# Patient Record
Sex: Male | Born: 1960 | Race: Black or African American | Hispanic: No | Marital: Married | Smoking: Current every day smoker
Health system: Southern US, Community
[De-identification: ages and names within clinical notes are randomized; demographics above are authoritative.]

## PROBLEM LIST (undated history)

## (undated) DIAGNOSIS — E7800 Pure hypercholesterolemia, unspecified: Secondary | ICD-10-CM

## (undated) DIAGNOSIS — I10 Essential (primary) hypertension: Secondary | ICD-10-CM

## (undated) DIAGNOSIS — I4891 Unspecified atrial fibrillation: Secondary | ICD-10-CM

## (undated) HISTORY — PX: NO PAST SURGERIES: SHX2092

---

## 2011-09-07 ENCOUNTER — Emergency Department: Payer: Self-pay | Admitting: Emergency Medicine

## 2013-06-05 ENCOUNTER — Inpatient Hospital Stay: Payer: Self-pay | Admitting: Family Medicine

## 2013-06-05 LAB — APTT
Activated PTT: 26.8 secs (ref 23.6–35.9)
Activated PTT: 40 secs — ABNORMAL HIGH (ref 23.6–35.9)

## 2013-06-05 LAB — CBC
HCT: 43.1 % (ref 40.0–52.0)
HGB: 14.3 g/dL (ref 13.0–18.0)
MCH: 25.6 pg — ABNORMAL LOW (ref 26.0–34.0)
MCHC: 33.2 g/dL (ref 32.0–36.0)
MCV: 77 fL — ABNORMAL LOW (ref 80–100)
Platelet: 213 10*3/uL (ref 150–440)
RBC: 5.59 10*6/uL (ref 4.40–5.90)
RDW: 14.9 % — ABNORMAL HIGH (ref 11.5–14.5)
WBC: 8.6 10*3/uL (ref 3.8–10.6)

## 2013-06-05 LAB — COMPREHENSIVE METABOLIC PANEL
Albumin: 4 g/dL (ref 3.4–5.0)
Alkaline Phosphatase: 53 U/L (ref 50–136)
Anion Gap: 3 — ABNORMAL LOW (ref 7–16)
BUN: 13 mg/dL (ref 7–18)
Bilirubin,Total: 0.4 mg/dL (ref 0.2–1.0)
Calcium, Total: 9.1 mg/dL (ref 8.5–10.1)
Chloride: 109 mmol/L — ABNORMAL HIGH (ref 98–107)
Co2: 29 mmol/L (ref 21–32)
Creatinine: 0.87 mg/dL (ref 0.60–1.30)
EGFR (African American): >60
EGFR (Non-African Amer.): >60
Glucose: 92 mg/dL (ref 65–99)
Osmolality: 281 (ref 275–301)
Potassium: 3.9 mmol/L (ref 3.5–5.1)
SGOT(AST): 22 U/L (ref 15–37)
SGPT (ALT): 27 U/L (ref 12–78)
Sodium: 141 mmol/L (ref 136–145)
Total Protein: 7.5 g/dL (ref 6.4–8.2)

## 2013-06-05 LAB — PROTIME-INR
INR: 0.9
Prothrombin Time: 12.8 secs (ref 11.5–14.7)

## 2013-06-05 LAB — CK TOTAL AND CKMB (NOT AT ARMC)
CK, Total: 231 U/L (ref 35–232)
CK-MB: 3.6 ng/mL (ref 0.5–3.6)

## 2013-06-05 LAB — TROPONIN I: Troponin-I: 0.15 ng/mL — ABNORMAL HIGH

## 2013-06-06 LAB — BASIC METABOLIC PANEL
Anion Gap: 2 — ABNORMAL LOW (ref 7–16)
BUN: 11 mg/dL (ref 7–18)
Calcium, Total: 8.7 mg/dL (ref 8.5–10.1)
Chloride: 107 mmol/L (ref 98–107)
Co2: 29 mmol/L (ref 21–32)
Creatinine: 0.8 mg/dL (ref 0.60–1.30)
EGFR (African American): >60
EGFR (Non-African Amer.): >60
Glucose: 98 mg/dL (ref 65–99)
Osmolality: 275 (ref 275–301)
Potassium: 4 mmol/L (ref 3.5–5.1)
Sodium: 138 mmol/L (ref 136–145)

## 2013-06-06 LAB — CK-MB
CK-MB: 2.9 ng/mL (ref 0.5–3.6)
CK-MB: 3.2 ng/mL (ref 0.5–3.6)

## 2013-06-06 LAB — LIPID PANEL
Cholesterol: 172 mg/dL (ref 0–200)
HDL Cholesterol: 44 mg/dL (ref 40–60)
Ldl Cholesterol, Calc: 101 mg/dL — ABNORMAL HIGH (ref 0–100)
Triglycerides: 136 mg/dL (ref 0–200)
VLDL Cholesterol, Calc: 27 mg/dL (ref 5–40)

## 2013-06-06 LAB — APTT
Activated PTT: 42.6 secs — ABNORMAL HIGH (ref 23.6–35.9)
Activated PTT: 25.2 secs (ref 23.6–35.9)

## 2013-06-06 LAB — HEMOGLOBIN A1C: Hemoglobin A1C: 5.7 % (ref 4.2–6.3)

## 2013-06-06 LAB — TROPONIN I
Troponin-I: 0.13 ng/mL — ABNORMAL HIGH
Troponin-I: 0.13 ng/mL — ABNORMAL HIGH

## 2015-03-07 NOTE — Discharge Summary (Signed)
PATIENT NAME:  Ronald Henry, Ronald Henry MR#:  [id number] DATE OF BIRTH:  January 07, 1961  DATE OF ADMISSION:  06/05/2013 DATE OF DISCHARGE:  06/06/2013   DISCHARGE DIAGNOSES:  1. History of atrial fibrillation, on Coumadin, now in sinus rhythm.  2. Hyperlipidemia.   DISCHARGE MEDICATIONS:  1. Coumadin 5 mg p.o. daily.  2. Atorvastatin 20 mg p.o. q.h.s.  3. Metoprolol 25 mg p.o. b.i.d.   CONSULTATIONS: Cardiology.   PROCEDURES: Had an echocardiogram that was within normal limits.   PERTINENT LABORATORIES: On Ronald Henry of discharge, last INR was 0.9 on 06/05/2013. Troponin 0.15, 0.13, 0.13, CK-MB within normal limits. Sodium 138, potassium 4.8, creatinine 0.8. A1c of 5.7. LDL of 101, HDL 44, total cholesterol 172, triglycerides 136. LFTs within normal limits. White blood cell count 8.6, hemoglobin 14.3 and platelets of 213.   BRIEF HOSPITAL COURSE: Atrial fibrillation. The patient was initially admitted and found to be in atrial fibrillation with rapid ventricular response. Was placed on diltiazem drip. Was transitioned over to metoprolol, and he has been in normal rhythm with a sinus rhythm and asymptomatic at this point. He was started on Coumadin per Dr. Ubaldo Glassing. Plan is to discharge on Coumadin for at least a month if he remains in sinus rhythm. Will repeat an EKG in a week. Will repeat INR this Friday. Continue with the metoprolol and the statin. The patient is in stable condition and will be discharged to home. Follow up in 1 week.   ____________________________ Dion Body, MD kl:OSi D: 06/07/2013 08:11:00 ET T: 06/07/2013 08:47:33 ET JOB#: [id number]  cc: Dion Body, MD, <Dictator> Dion Body MD ELECTRONICALLY SIGNED 06/11/2013 11:02

## 2015-03-07 NOTE — H&P (Signed)
PATIENT NAME:  Ronald Henry, PABST MR#:  [id number] DATE OF BIRTH:  07/26/1961  DATE OF ADMISSION:  06/05/2013  PRIMARY CARE PHYSICIAN: None.   REFERRING EMERGENCY ROOM PHYSICIAN:  Lavonia Drafts, MD.   CHIEF COMPLAINT: Chest tightness, excessive sweating.   HISTORY OF PRESENT ILLNESS: This is a 54 year old male with not known past medical history and not following with any primary care physician. Just recently had his physical checkup appointment at [facility], but that was his first visit, and he has not picked up any primary care physician yet.   His blood test was done. Blood pressure was checked and, as per the patient, all the lab results and his blood pressure and everything were normal at his visit 2 months ago. He was advised to go for the colonoscopy appointment as per his age, which he still had to schedule.   Today, then, he was at his work around 10:00 a.m. He started feeling excessive sweating on his chest and he started feeling funny fluttering feeling in his chest. He also had some heaviness or some funny sensation in his left arm, and so he took some rest and then was planning to take off and go home, spoke to his wife, but she advised him to go to the Emergency Room. So he just went to the walk-in clinic in [place], and after listening to his history, the nurse suggested he go to the Emergency Room instead of getting any further testing.    On arrival to ER, his EKG showed atrial fibrillation with rapid ventricular response and injection of Cardizem was given by ER, to which he responded. His heart rate slowed down and went up to 90s.   His lab results suggested his troponin level to be elevated, and so he is being admitted for acute MI under hospitalist service. On further questioning, the patient denies any other complaint of shortness of breath, dizziness, or cough. He had similar episode almost six months ago, but at that time, he did not have any sensation in his arms. He took aspirin and  took some rest and his pain resolved. He does not do much physical activity, but while walking around the building, he does not feel any chest pain so far.   REVIEW OF SYSTEMS:  CONSTITUTIONAL: Negative for fever, fatigue, weakness, pain or weight loss.  EYES: No blurring, double vision, pain or redness.  EARS, NOSE, THROAT: No tinnitus, ear pain, hearing loss.  RESPIRATORY: No cough, wheezing, hemoptysis, or painful respiration.  CARDIOVASCULAR: Some chest pain and arrhythmia with palpitation feeling but no syncopal episode.  GASTROINTESTINAL: No nausea, vomiting, diarrhea or abdominal pain.  GENITOURINARY: No dysuria, hematuria or increased frequency.  ENDOCRINE: No nocturia, heat or cold intolerance.  SKIN: No acne or rashes.  MUSCULOSKELETAL: No pain or swelling in the joints.  NEUROLOGICAL: No numbness, weakness, tremors or vertigo.  PSYCHIATRIC: No anxiety, insomnia or bipolar disorder.   PAST MEDICAL HISTORY: None.   PAST SURGICAL HISTORY: None.   MEDICATIONS: Ibuprofen on and off for pains and aches, which he says is taking almost every other day or every 2 or 3 days.   FAMILY HISTORY: Positive for diabetes and hypertension in multiple family members and his mother died of stroke. He denies any family history of having coronary artery disease.   SOCIAL HISTORY: He smokes 5 to 6 cigarettes currently per day. In the past he was smoking  10 or more cigarettes, trying to cut down for the last few months. Denies drinking alcohol  regularly and denies doing any drugs. 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   PHYSICAL EXAMINATION:  VITAL SIGNS: In the ER, temperature 98.6, pulse rate 135, respirations 18, and blood pressure 149/92  on arrival to ER. Oxygen saturation 95% on room air. Currently, his heart rate is running in 80s and 90s and blood pressure is 141/65.  GENERAL: He is fully alert and oriented to time, place and person and cooperative with history taking  and physical examination.  HEENT: Head and neck atraumatic. Conjunctivae pink. Oral mucosa moist.  NECK: Supple. No JVD.  RESPIRATORY: Bilateral clear and equal air entry.  CARDIOVASCULAR: S1, S2 present. Regular. No murmur.  ABDOMEN: Soft, nontender. Bowel sounds present. Obese. No organomegaly.  SKIN: No rashes.  LEGS: No edema.  JOINTS: No swelling or tenderness.  NEUROLOGICAL: Power 5/5. Moves all four limbs. No tremors.  PSYCHIATRIC: Does not appear in any acute psychiatric illness.   LABORATORY RESULTS: Glucose 92, BUN 13, creatinine 0.87, sodium 141, potassium 3.9, chloride 109, CO2 of 29, anion gap 3, calcium 9.1, total protein 7.5, albumin 4.0, alkaline phosphatase 53, SGOT 22, SGPT 27. Troponin 0.15. WBC 8.6, hemoglobin 14.3, platelet count 21.3, MCV 77. INR 0.9.   IMPORTANT LABORATORY RESULTS IN THE HOSPITAL: Chest x-ray, portable, low-grade congestive heart failure. No focal pneumonia.   ASSESSMENT AND PLAN: A 54 year old male with past medical history of none. He is obese and a smoker. He came to ER with chest tightness, sweating and palpitations, and found having atrial fibrillation with rapid ventricular rhythm. Responded to injection of calcium channel blocker and troponin is slightly elevated.  1.  Non-ST-elevation myocardial infarction. Troponin is 0.15. This might be as a result of atrial fibrillation with rapid ventricular response but his symptoms are very typical, so ER physician started him on heparin IV drip. We will continue that and monitor him on telemetry. Will follow serial troponins and we will do echocardiogram. Cardiology consult for further management, and we will also start him on beta blockers, statin, and aspirin and will check his lipid panel.  2.  Atrial fibrillation with rapid ventricular response. He currently responded to IV Cardizem injection. He is on heparin drip for his non-ST-elevation myocardial infarction. We will monitor him on telemetry and further  management as per cardiology  3.  Current smoker. Smoking cessation counseling done for five minutes by me. He agrees to stop smoking now, and we will give him nicotine patch while he is in the hospital.   CODE STATUS:  FULL CODE.   TOTAL CRITICAL CARE TIME: Spent 60 minutes for this admission.      ____________________________ Ceasar Lund Anselm Jungling, MD [id number] D: 06/05/2013 17:06:00 ET T: 06/05/2013 17:58:42 ET JOB#: [id number]  cc: Ceasar Lund. Anselm Jungling, MD, <Dictator> Vaughan Basta MD ELECTRONICALLY SIGNED 06/19/2013 23:15

## 2015-03-07 NOTE — Consult Note (Signed)
   Present Illness Pt with history of hypertension who was admitted after presenting to a wallk in clinic with complaints of diapharesis and palpitations. He was noted to have afib and was sent to the er where he was noted to be in afib with rvr. He had a minimal troponin elevation. Echo revealed lvh with preserved lv funciton. He was placed on cardizem with improvment in his blood presure an dheart rate. CHADSS score is 1. He is currently stable with afib with rates of 70-90. He had some chest tightness with his rapid heart rate. NO histoyr of exertional chest pain.   Physical Exam:  GEN obese   HEENT PERRL   NECK supple   RESP normal resp effort  clear BS  no use of accessory muscles   CARD Irregular rate and rhythm  Normal, S1, S2  No murmur   ABD denies tenderness  no Adominal Mass   LYMPH negative neck, negative axillae   EXTR negative cyanosis/clubbing, negative edema   SKIN normal to palpation   NEURO cranial nerves intact, motor/sensory function intact   PSYCH A+O to time, place, person   Review of Systems:  Subjective/Chief Complaint rapid heart rate and sweating. left arm numbness   General: Fatigue  Weakness   Skin: No Complaints   ENT: No Complaints   Eyes: No Complaints   Neck: No Complaints   Respiratory: Short of breath   Cardiovascular: Palpitations  Dyspnea   Gastrointestinal: No Complaints   Genitourinary: No Complaints   Vascular: No Complaints   Musculoskeletal: No Complaints   Neurologic: No Complaints   Hematologic: No Complaints   Endocrine: No Complaints   Psychiatric: No Complaints   Review of Systems: All other systems were reviewed and found to be negative   Medications/Allergies Reviewed Medications/Allergies reviewed   EKG:  Interpretation afib with rapid ventricular resonse    No Known Allergies:    Impression 54 yo male with history of hypertension admitted with new onset afib with rapid vr. HGad mild troponin  elevation to 0.13 likely seocndary to demand ischemia given rapid heart rate. He is rate controlled currently on metoprolol 25 bid. CHADSS score is 1. Would add couimadin at 4 mg daily and ambulate this afternoon. COnsider discharge to home on metoprolol bid and coumadin. Will follow up as outpatient for further recomneations to include consideration for cardioversion if he statys in afib. Also will need work up for possible sleep apnea.   Plan 1. Metoprolol 25 bid 2. Warfarin 4 mg daily 3. AMbulate and consider discharge if stable 4. INR in 5-7 days 5. Stop smoking 6. Weight loss 7. Will see back as outpatient in 1 week   Electronic Signatures: Teodoro Spray (MD)  (Signed 23-Jul-14 15:13)  Authored: General Aspect/Present Illness, History and Physical Exam, Review of System, EKG , Allergies, Impression/Plan   Last Updated: 23-Jul-14 15:13 by Teodoro Spray (MD)

## 2015-07-01 ENCOUNTER — Ambulatory Visit
Admission: EM | Admit: 2015-07-01 | Discharge: 2015-07-01 | Disposition: A | Discharge status: Home / Self Care | Payer: Worker's Compensation | Attending: Family Medicine | Admitting: Family Medicine

## 2015-07-01 ENCOUNTER — Encounter: Payer: Self-pay | Admitting: Emergency Medicine

## 2015-07-01 DIAGNOSIS — S0502XA Injury of conjunctiva and corneal abrasion without foreign body, left eye, initial encounter: Secondary | ICD-10-CM | POA: Diagnosis not present

## 2015-07-01 DIAGNOSIS — T1592XA Foreign body on external eye, part unspecified, left eye, initial encounter: Secondary | ICD-10-CM | POA: Diagnosis not present

## 2015-07-01 HISTORY — DX: Unspecified atrial fibrillation: I48.91

## 2015-07-01 MED ORDER — HYDROCODONE-ACETAMINOPHEN 5-325 MG PO TABS: 1.0000 | ORAL_TABLET | Freq: Three times a day (TID) | ORAL | Status: DC | PRN

## 2015-07-01 MED ORDER — GENTAMICIN SULFATE 0.3 % OP OINT
TOPICAL_OINTMENT | Freq: Three times a day (TID) | OPHTHALMIC | Status: DC
Start: 2015-07-01 — End: 2016-06-07

## 2015-07-01 NOTE — Discharge Instructions (Signed)
Corneal Abrasion The cornea is the clear covering at the front and center of the eye. When you look at the colored portion of the eye, you are looking through the cornea. It is a thin tissue made up of layers. The top layer is the most sensitive layer. A corneal abrasion happens if this layer is scratched or an injury causes it to come off.  HOME CARE  You may be given drops or a medicated cream. Use the medicine as told by your doctor.  A pressure patch may be put over the eye. If this is done, follow your doctor's instructions for when to remove the patch. Do not drive or use machines while the eye patch is on. Judging distances is hard to do with a patch on.  See your doctor for a follow-up exam if you are told to do so. It is very important that you keep this appointment. GET HELP IF:   You have pain, are sensitive to light, and have a scratchy feeling in one eye or both eyes.  Your pressure patch keeps getting loose. You can blink your eye under the patch.  You have fluid coming from your eye or the lids stick together in the morning.  You have the same symptoms in the morning that you did with the first abrasion. This could be days, weeks, or months after the first abrasion healed. MAKE SURE YOU:   Understand these instructions.  Will watch your condition.  Will get help right away if you are not doing well or get worse. Document Released: 04/19/2008 Document Revised: 08/22/2013 Document Reviewed: 07/09/2013 [facility] Patient Information 2015 [place], [place]. This information is not intended to replace advice given to you by your health care provider. Make sure you discuss any questions you have with your health care provider.  Eye, Foreign Body A foreign body is an object that should not be there. The object could be near, on, or in the eye. HOME CARE If your doctor prescribes an eye patch:  Keep the eye patch on. Do this until you see your doctor again.  Do not remove the  patch to put in medicine unless your doctor tells you.  Retape it as it was before:  When replacing the patch.  If the patch comes loose.  Do not drive or use machinery.  Only take medicine as told by your doctor. If your doctor does not prescribe an eye patch:  Keep the eye closed as much as possible.  Do not rub the eye.  Wear dark glasses in bright light.  Do not wear contact lenses until the eye feels normal, or as told by your doctor.  Wear protective eye covering, especially when using high speed tools.  Only take medicine as told by your doctor. GET HELP RIGHT AWAY IF:   Your pain gets worse.  Your vision changes.  You have problems with the eye patch.  The injury gets larger.  There is fluid (discharge) coming from the eye.  You get puffiness (swelling) and soreness.  You have an oral temperature above 102 F (38.9 C), not controlled by medicine.  Your baby is older than 3 months with a rectal temperature of 102 F (38.9 C) or higher.  Your baby is 75 months old or younger with a rectal temperature of 100.4 F (38 C) or higher. MAKE SURE YOU:   Understand these instructions.  Will watch your condition.  Will get help right away if you are not doing well  or get worse. Document Released: 04/21/2010 Document Revised: 01/24/2012 Document Reviewed: 03/29/2013 [facility] Patient Information 2015 [place], [place]. This information is not intended to replace advice given to you by your health care provider. Make sure you discuss any questions you have with your health care provider.

## 2015-07-01 NOTE — ED Provider Notes (Signed)
CSN: [id number]     Arrival date & time 07/01/15  1513 History   First MD Initiated Contact with Patient 07/01/15 1601     Chief Complaint  Patient presents with  . Eye Problem   patient reports having left eye injury before placement over a year. 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 left eye still irritated despite using eyedrops yesterday she did know the name when exactly it was an tried flushing the shower at home. He does report rubbing the eye excessively since last night. (Consider location/radiation/quality/duration/timing/severity/associated sxs/prior Treatment) Patient is a 54 y.o. male presenting with eye problem. The history is provided by the patient.  Eye Problem Location:  L eye Quality:  Aching, burning, foreign body sensation and stinging Severity:  Moderate Onset quality:  Sudden Duration:  1 day Progression:  Worsening Chronicity:  New Context: scratch   Context comment:  Patient reports having grass debris blown in his left eye yesterday as he was making delivery blow clippings away from him and 72 and clippings right directly at him. He did apologize that he wasn't thinking about what he was doing. Relieved by:  Nothing Ineffective treatments:  Eye drops and flushing (He has tried using flushing his eyes with shower water and also she states that he's been using eyedrops given to him before he had a corneal abrasion 3 years ago.) Associated symptoms: discharge, foreign body sensation, inflammation, itching, swelling and tearing   Risk factors: exposure to pinkeye    Past medical history also shows a reveals the patient now has had A. fib but he is taking cholesterol medicine well and blood pressure medicine. States he was diagnosed with A. fib elevated blood pressure and hyperlipidemia last year. He states her also to prevent thrombotic phenomena from  occurring. He's currently looking to get a new cardiologist since the current one is out of his network. A portion does smoke daily Past Medical History  Diagnosis Date  . Atrial fibrillation    History reviewed. No pertinent past surgical history. History reviewed. No pertinent family history. Social History  Substance Use Topics  . Smoking status: Current Every Day Smoker -- 0.50 packs/day    Types: Cigarettes  . Smokeless tobacco: None  . Alcohol Use: Yes    Review of Systems  Constitutional: Negative for activity change and appetite change.  Eyes: Positive for discharge and itching.  All other systems reviewed and are negative.   Allergies  Review of patient's allergies indicates no known allergies.  Home Medications   Prior to Admission medications   Medication Sig Start Date End Date Taking? Authorizing Provider  rivaroxaban (XARELTO) 20 MG TABS tablet Take 20 mg by mouth daily with supper.   Yes Historical Provider, MD  gentamicin (GARAMYCIN) 0.3 % ophthalmic ointment Place into the left eye 3 (three) times daily. One drop 07/01/15   Frederich Cha, MD  HYDROcodone-acetaminophen [facility]) 5-325 MG per tablet Take 1 tablet by mouth every 8 (eight) hours as needed for moderate pain (may cause sedation). 07/01/15   Frederich Cha, MD   BP 130/73 mmHg  Pulse 68  Temp(Src) 97.7 F (36.5 C) (Oral)  Resp 16  SpO2 99% Physical Exam  Constitutional: He is oriented to person, place, and time. He appears well-developed and well-nourished.  HENT:  Head: Normocephalic and atraumatic.  Eyes: Pupils are equal, round, and reactive to  light. Left eye exhibits discharge. Foreign body present in the left eye. Left conjunctiva is injected.  Fundoscopic exam:      The right eye shows no arteriolar narrowing and no AV nicking.       The left eye shows no arteriolar narrowing and no AV nicking.  Neck: Neck supple.  Neurological: He is alert and oriented to person, place, and time.  Skin: Skin  is warm.  Psychiatric: He has a normal mood and affect. His behavior is normal.  Vitals reviewed.   ED Course  FOREIGN BODY REMOVAL Date/Time: 07/01/2015 5:24 PM Performed by: Frederich Cha Authorized by: Frederich Cha Consent: Verbal consent obtained. Body area: eye Location details: left conjunctiva Local anesthetic: tetracaine drops Patient sedated: no Patient restrained: no Localization method: eyelid eversion and magnification Removal mechanism: eyelid eversion and moist cotton swab Eye examined with fluorescein. Corneal abrasion size: small Corneal abrasion location: inferior No residual rust ring present. Depth: superficial Complexity: simple 2 objects recovered. Post-procedure assessment: foreign body removed Patient tolerance: Patient tolerated the procedure well with no immediate complications   (including critical care time) Labs Review Labs Reviewed - No data to display  Imaging Review No results found.   MDM   1. Cornea abrasion, left, initial encounter   2. Eye foreign bodies, left, initial encounter     Patient tolerated the removal of the foreign object from his left eye and was sent home with work instructions to return to work tomorrow but has not gotten so might stay inside her workplace. Gentamicin or Garamycin eyedrops given and Vicodin for pain. Patient instructed to return Monday since his workers comp for complete clearance to return to work on Thursday. The patient stopped about first pass and returned and probably will need ophthalmology referral that time.   Frederich Cha, MD 07/01/15 [phone]

## 2015-07-01 NOTE — ED Notes (Signed)
Pt states that he was at work 06/30/2015 and a co-worker was blowing off the side walk and he turned and grass/dirt flew into his left eye.. Pt states that his left eye has gotten more irritated over the course of the day.

## 2015-07-09 ENCOUNTER — Ambulatory Visit
Admission: EM | Admit: 2015-07-09 | Discharge: 2015-07-09 | Disposition: A | Discharge status: Home / Self Care | Payer: Worker's Compensation | Attending: Family Medicine | Admitting: Family Medicine

## 2015-07-09 ENCOUNTER — Encounter: Payer: Self-pay | Admitting: Emergency Medicine

## 2015-07-09 DIAGNOSIS — H5712 Ocular pain, left eye: Secondary | ICD-10-CM | POA: Diagnosis not present

## 2015-07-09 NOTE — ED Notes (Signed)
Pt to have a recheck of left eye

## 2015-07-09 NOTE — ED Provider Notes (Signed)
Patient presents today for recheck of left eye foreign body removal. Patient was seen about 8 days ago and foreign body was removed by Dr. Alveta Heimlich. Patient was given antibiotic drops and pain medication. Patient states that he got better for a few days and then the symptoms returned today. He denies any possibility of new foreign body getting into his eye. He denies any vision difficulty.   ROS: Negative except mentioned above. Vitals as per Epic.  GENERAL: NAD HEENT: bilateral conjunctival erythema L>R, no discharge, no corneal abrasion or FB noted after flurosein testing , PEERL, EOMI  A/P: L eye pain- tetracaine was used with flurosein strip but no fb or corneal abrasion seen, discussed with caseworker that patient should be evaluated by ophthalmologist thoroughly. The caseworker has approved the patient to be seen at [facility] in [place] today, patient will be worked into the schedule.   Paulina Fusi, MD 07/09/15 1316

## 2016-03-08 ENCOUNTER — Telehealth: Payer: Self-pay | Admitting: Gastroenterology

## 2016-03-08 NOTE — Telephone Encounter (Signed)
colonoscopy

## 2016-03-31 ENCOUNTER — Telehealth: Payer: Self-pay | Admitting: Gastroenterology

## 2016-03-31 NOTE — Telephone Encounter (Signed)
colonoscopy

## 2016-04-02 ENCOUNTER — Other Ambulatory Visit: Payer: Self-pay

## 2016-04-02 NOTE — Telephone Encounter (Signed)
Pt scheduled for screening colonoscopy at [facility] on 05/14/16. Instructs/rx mailed. Please precert.

## 2016-04-02 NOTE — Telephone Encounter (Signed)
Gastroenterology Pre-Procedure Review  Request Date: 05/14/16 Requesting Physician: Dr.   PATIENT REVIEW QUESTIONS: The patient responded to the following health history questions as indicated:    1. Are you having any GI issues? no 2. Do you have a personal history of Polyps? no 3. Do you have a family history of Colon Cancer or Polyps? no 4. Diabetes Mellitus? no 5. Joint replacements in the past 12 months?no 6. Major health problems in the past 3 months?no 7. Any artificial heart valves, MVP, or defibrillator?no    MEDICATIONS & ALLERGIES:    Patient reports the following regarding taking any anticoagulation/antiplatelet therapy:   Plavix, Coumadin, Eliquis, Xarelto, Lovenox, Pradaxa, Brilinta, or Effient? yes (Xarelto 20mg ) Aspirin? no  Patient confirms/reports the following medications:  Current Outpatient Prescriptions  Medication Sig Dispense Refill  . gentamicin (GARAMYCIN) 0.3 % ophthalmic ointment Place into the left eye 3 (three) times daily. One drop 3.5 g 0  . HYDROcodone-acetaminophen (NORCO) 5-325 MG per tablet Take 1 tablet by mouth every 8 (eight) hours as needed for moderate pain (may cause sedation). 20 tablet 0  . rivaroxaban (XARELTO) 20 MG TABS tablet Take 20 mg by mouth daily with supper.     No current facility-administered medications for this visit.    Patient confirms/reports the following allergies:  No Known Allergies  No orders of the defined types were placed in this encounter.    AUTHORIZATION INFORMATION Primary Insurance: 1D#: Group #:  Secondary Insurance: 1D#: Group #:  SCHEDULE INFORMATION: Date: 05/14/16 Time: Location: [place]

## 2016-04-08 NOTE — Telephone Encounter (Signed)
Authorization has been approved online with [employer]. CPT: [id number].

## 2016-04-27 ENCOUNTER — Telehealth: Payer: Self-pay | Admitting: Gastroenterology

## 2016-04-27 NOTE — Telephone Encounter (Signed)
Patient has colonoscopy June 30. Has appointment with the heart doctor July 15. Please call. He can reschedule anytime after that.

## 2016-04-27 NOTE — Telephone Encounter (Signed)
Pt rescheduled colonoscopy to July 28th. [place] notified.

## 2016-06-07 ENCOUNTER — Encounter: Payer: Self-pay | Admitting: *Deleted

## 2016-06-07 NOTE — Discharge Instructions (Signed)

## 2016-07-16 ENCOUNTER — Ambulatory Visit
Admission: RE | Admit: 2016-07-16 | Discharge: 2016-07-16 | Disposition: A | Discharge status: Home / Self Care | Payer: Commercial Managed Care - HMO | Source: Ambulatory Visit | Attending: Gastroenterology | Admitting: Gastroenterology

## 2016-07-16 ENCOUNTER — Ambulatory Visit
Admission: RE | Disposition: A | Discharge status: Home / Self Care | Payer: Self-pay | Source: Ambulatory Visit | Attending: Gastroenterology

## 2016-07-16 ENCOUNTER — Ambulatory Visit: Payer: Commercial Managed Care - HMO | Admitting: Anesthesiology

## 2016-07-16 DIAGNOSIS — I10 Essential (primary) hypertension: Secondary | ICD-10-CM | POA: Insufficient documentation

## 2016-07-16 DIAGNOSIS — D123 Benign neoplasm of transverse colon: Secondary | ICD-10-CM

## 2016-07-16 DIAGNOSIS — K641 Second degree hemorrhoids: Secondary | ICD-10-CM | POA: Diagnosis not present

## 2016-07-16 DIAGNOSIS — F17210 Nicotine dependence, cigarettes, uncomplicated: Secondary | ICD-10-CM | POA: Insufficient documentation

## 2016-07-16 DIAGNOSIS — I4891 Unspecified atrial fibrillation: Secondary | ICD-10-CM | POA: Insufficient documentation

## 2016-07-16 DIAGNOSIS — Z1211 Encounter for screening for malignant neoplasm of colon: Secondary | ICD-10-CM | POA: Diagnosis not present

## 2016-07-16 DIAGNOSIS — D125 Benign neoplasm of sigmoid colon: Secondary | ICD-10-CM | POA: Diagnosis not present

## 2016-07-16 DIAGNOSIS — D122 Benign neoplasm of ascending colon: Secondary | ICD-10-CM | POA: Diagnosis not present

## 2016-07-16 DIAGNOSIS — Z79899 Other long term (current) drug therapy: Secondary | ICD-10-CM | POA: Diagnosis not present

## 2016-07-16 DIAGNOSIS — E7800 Pure hypercholesterolemia, unspecified: Secondary | ICD-10-CM | POA: Insufficient documentation

## 2016-07-16 DIAGNOSIS — Z7901 Long term (current) use of anticoagulants: Secondary | ICD-10-CM | POA: Insufficient documentation

## 2016-07-16 HISTORY — PX: COLONOSCOPY WITH PROPOFOL: SHX5780

## 2016-07-16 HISTORY — DX: Pure hypercholesterolemia, unspecified: E78.00

## 2016-07-16 HISTORY — DX: Essential (primary) hypertension: I10

## 2016-07-16 HISTORY — PX: POLYPECTOMY: SHX5525

## 2016-07-16 SURGERY — COLONOSCOPY WITH PROPOFOL
Anesthesia: Monitor Anesthesia Care

## 2016-07-16 MED ORDER — ONDANSETRON HCL 4 MG/2ML IJ SOLN: 4.0000 mg | Freq: Once | INTRAMUSCULAR | Status: DC | PRN

## 2016-07-16 MED ORDER — PROPOFOL 10 MG/ML IV BOLUS
INTRAVENOUS | Status: DC | PRN
  Administered 2016-07-16 (×2): 20 mg via INTRAVENOUS
  Administered 2016-07-16: 10 mg via INTRAVENOUS
  Administered 2016-07-16: 20 mg via INTRAVENOUS
  Administered 2016-07-16: 70 mg via INTRAVENOUS
  Administered 2016-07-16 (×2): 20 mg via INTRAVENOUS

## 2016-07-16 MED ORDER — ACETAMINOPHEN 160 MG/5ML PO SOLN: 325.0000 mg | ORAL | Status: DC | PRN

## 2016-07-16 MED ORDER — LACTATED RINGERS IV SOLN
INTRAVENOUS | Status: DC
  Administered 2016-07-16: 09:00:00 via INTRAVENOUS

## 2016-07-16 MED ORDER — LIDOCAINE HCL (CARDIAC) 20 MG/ML IV SOLN
INTRAVENOUS | Status: DC | PRN
  Administered 2016-07-16: 50 mg via INTRAVENOUS

## 2016-07-16 MED ORDER — STERILE WATER FOR IRRIGATION IR SOLN
Status: DC | PRN
  Administered 2016-07-16: 11:00:00

## 2016-07-16 MED ORDER — ACETAMINOPHEN 325 MG PO TABS: 325.0000 mg | ORAL_TABLET | ORAL | Status: DC | PRN

## 2016-07-16 SURGICAL SUPPLY — 23 items

## 2016-07-16 NOTE — Anesthesia Postprocedure Evaluation (Signed)
Anesthesia Post Note  Patient: Ronald Henry  Procedure(s) Performed: Procedure(s) (LRB): COLONOSCOPY WITH PROPOFOL (N/A) POLYPECTOMY  Patient location during evaluation: PACU Anesthesia Type: MAC Level of consciousness: awake and alert and oriented Pain management: pain level controlled Vital Signs Assessment: post-procedure vital signs reviewed and stable Respiratory status: spontaneous breathing and nonlabored ventilation Cardiovascular status: stable Postop Assessment: no signs of nausea or vomiting and adequate PO intake Anesthetic complications: no    Estill Batten

## 2016-07-16 NOTE — Transfer of Care (Signed)
Immediate Anesthesia Transfer of Care Note  Patient: Ronald Henry  Procedure(s) Performed: Procedure(s): COLONOSCOPY WITH PROPOFOL (N/A) POLYPECTOMY  Patient Location: PACU  Anesthesia Type: MAC  Level of Consciousness: awake, alert  and patient cooperative  Airway and Oxygen Therapy: Patient Spontanous Breathing and Patient connected to supplemental oxygen  Post-op Assessment: Post-op Vital signs reviewed, Patient's Cardiovascular Status Stable, Respiratory Function Stable, Patent Airway and No signs of Nausea or vomiting  Post-op Vital Signs: Reviewed and stable  Complications: No apparent anesthesia complications

## 2016-07-16 NOTE — H&P (Signed)
  Lucilla Lame, MD [facility] [address] [place], [place] [address] Phone: [phone] Fax : [phone]  Primary Care Physician:  No primary care provider on file. Primary Gastroenterologist:  Dr. Allen Norris  Pre-Procedure History & Physical: HPI:  Ronald Henry is a 55 y.o. male is here for a screening colonoscopy.   Past Medical History:  Diagnosis Date  . Atrial fibrillation ([place])   . Hypercholesteremia   . Hypertension     Past Surgical History:  Procedure Laterality Date  . NO PAST SURGERIES      Prior to Admission medications   Medication Sig Start Date End Date Taking? Authorizing Provider  atorvastatin (LIPITOR) 40 MG tablet Take 40 mg by mouth daily.   Yes Historical Provider, MD  Cholecalciferol (VITAMIN D PO) Take by mouth daily.   Yes Historical Provider, MD  metoprolol succinate (TOPROL-XL) 25 MG 24 hr tablet Take 25 mg by mouth 2 (two) times daily.   Yes Historical Provider, MD  rivaroxaban (XARELTO) 20 MG TABS tablet Take 20 mg by mouth daily with supper.   Yes Historical Provider, MD    Allergies as of 04/02/2016  . (No Known Allergies)    History reviewed. No pertinent family history.  Social History   Social History  . Marital status: Married    Spouse name: N/A  . Number of children: N/A  . Years of education: N/A   Occupational History  . Not on file.   Social History Main Topics  . Smoking status: Current Every Day Smoker    Packs/day: 0.25    Years: 25.00    Types: Cigarettes  . Smokeless tobacco: Never Used  . Alcohol use 1.8 oz/week    3 Shots of liquor per week  . Drug use: Unknown  . Sexual activity: Not on file   Other Topics Concern  . Not on file   Social History Narrative  . No narrative on file    Review of Systems: See HPI, otherwise negative ROS  Physical Exam: BP (!) 127/93   Pulse 74   Temp 97.7 F (36.5 C) (Temporal)   Resp 16   Ht 5\' 10"  (1.778 m)   Wt 251 lb (113.9 kg)   SpO2 98%   BMI 36.01 kg/m    General:   Alert,  pleasant and cooperative in NAD Head:  Normocephalic and atraumatic. Neck:  Supple; no masses or thyromegaly. Lungs:  Clear throughout to auscultation.    Heart:  Regular rate and rhythm. Abdomen:  Soft, nontender and nondistended. Normal bowel sounds, without guarding, and without rebound.   Neurologic:  Alert and  oriented x4;  grossly normal neurologically.  Impression/Plan: Ronald Henry is now here to undergo a screening colonoscopy.  Risks, benefits, and alternatives regarding colonoscopy have been reviewed with the patient.  Questions have been answered.  All parties agreeable.

## 2016-07-16 NOTE — Op Note (Signed)
[facility] Patient Name: Ronald Henry Procedure Date: 07/16/2016 10:42 AM MRN: [id number] Account #: [id number] Date of Birth: [birth year] Admit Type: Outpatient Age: 55 Room: [facility] OR ROOM 01 Gender: Male Note Status: Finalized Procedure:            Colonoscopy Indications:          Screening for colorectal malignant neoplasm Providers:            Lucilla Lame MD, MD Medicines:            Propofol per Anesthesia Complications:        No immediate complications. Procedure:            Pre-Anesthesia Assessment:                       - Prior to the procedure, a History and Physical was                        performed, and patient medications and allergies were                        reviewed. The patient's tolerance of previous                        anesthesia was also reviewed. The risks and benefits of                        the procedure and the sedation options and risks were                        discussed with the patient. All questions were                        answered, and informed consent was obtained. Prior                        Anticoagulants: The patient has taken no previous                        anticoagulant or antiplatelet agents. ASA Grade                        Assessment: II - A patient with mild systemic disease.                        After reviewing the risks and benefits, the patient was                        deemed in satisfactory condition to undergo the                        procedure.                       After obtaining informed consent, the colonoscope was                        passed under direct vision. Throughout the procedure,                        the patient's blood pressure,  pulse, and oxygen                        saturations were monitored continuously. The Olympus CF                        H180AL colonoscope (S#: [id number]) was introduced through                        the anus and advanced to the the  cecum, identified by                        appendiceal orifice and ileocecal valve. The                        colonoscopy was performed without difficulty. The                        patient tolerated the procedure well. The quality of                        the bowel preparation was excellent. Findings:      The perianal and digital rectal examinations were normal.      Two sessile polyps were found in the sigmoid colon. The polyps were 3 to       4 mm in size. These polyps were removed with a cold snare. Resection and       retrieval were complete.      A 4 mm polyp was found in the transverse colon. The polyp was sessile.       The polyp was removed with a cold biopsy forceps. Resection and       retrieval were complete.      A 5 mm polyp was found in the ascending colon. The polyp was sessile.       The polyp was removed with a cold snare. Resection and retrieval were       complete.      Non-bleeding internal hemorrhoids were found during retroflexion. The       hemorrhoids were Grade II (internal hemorrhoids that prolapse but reduce       spontaneously). Impression:           - Two 3 to 4 mm polyps in the sigmoid colon, removed                        with a cold snare. Resected and retrieved.                       - One 4 mm polyp in the transverse colon, removed with                        a cold biopsy forceps. Resected and retrieved.                       - One 5 mm polyp in the ascending colon, removed with a                        cold snare. Resected and retrieved.                       -  Non-bleeding internal hemorrhoids. Recommendation:       - Await pathology results.                       - Repeat colonoscopy in 5 years if polyp adenoma and 10                        years if hyperplastic                       - Discharge patient to home.                       - Resume previous diet.                       - Continue present medications. Procedure Code(s):    ---  Professional ---                       [phone], Colonoscopy, flexible; with removal of tumor(s),                        polyp(s), or other lesion(s) by snare technique                       45380, 48, Colonoscopy, flexible; with biopsy, single                        or multiple Diagnosis Code(s):    --- Professional ---                       Z12.11, Encounter for screening for malignant neoplasm                        of colon                       D12.5, Benign neoplasm of sigmoid colon                       D12.3, Benign neoplasm of transverse colon (hepatic                        flexure or splenic flexure)                       D12.2, Benign neoplasm of ascending colon CPT copyright 2016 American Medical Association. All rights reserved. The codes documented in this report are preliminary and upon coder review may  be revised to meet current compliance requirements. Lucilla Lame MD, MD 07/16/2016 10:58:42 AM This report has been signed electronically. Number of Addenda: 0 Note Initiated On: 07/16/2016 10:42 AM Scope Withdrawal Time: 0 hours 5 minutes 59 seconds  Total Procedure Duration: 0 hours 8 minutes 5 seconds       [facility]

## 2016-07-16 NOTE — Anesthesia Preprocedure Evaluation (Signed)
Anesthesia Evaluation  Patient identified by MRN, date of birth, ID band Patient awake    Reviewed: Allergy & Precautions, NPO status , Patient's Chart, lab work & pertinent test results  Airway Mallampati: II  TM Distance: >3 FB Neck ROM: Full    Dental no notable dental hx.    Pulmonary Current Smoker,    Pulmonary exam normal        Cardiovascular hypertension, + dysrhythmias Atrial Fibrillation  Rhythm:Irregular Rate:Normal     Neuro/Psych negative neurological ROS  negative psych ROS   GI/Hepatic negative GI ROS, Neg liver ROS,   Endo/Other  negative endocrine ROS  Renal/GU negative Renal ROS     Musculoskeletal negative musculoskeletal ROS (+)   Abdominal   Peds  Hematology negative hematology ROS (+)   Anesthesia Other Findings   Reproductive/Obstetrics                             Anesthesia Physical Anesthesia Plan  ASA: II  Anesthesia Plan: MAC   Post-op Pain Management:    Induction: Intravenous  Airway Management Planned:   Additional Equipment:   Intra-op Plan:   Post-operative Plan:   Informed Consent: I have reviewed the patients History and Physical, chart, labs and discussed the procedure including the risks, benefits and alternatives for the proposed anesthesia with the patient or authorized representative who has indicated his/her understanding and acceptance.     Plan Discussed with: CRNA  Anesthesia Plan Comments:         Anesthesia Quick Evaluation

## 2016-07-16 NOTE — Anesthesia Procedure Notes (Signed)
Procedure Name: MAC Performed by: Arica Bevilacqua Pre-anesthesia Checklist: Patient identified, Emergency Drugs available, Suction available, Timeout performed and Patient being monitored Patient Re-evaluated:Patient Re-evaluated prior to inductionOxygen Delivery Method: Nasal cannula Placement Confirmation: positive ETCO2       

## 2016-07-20 ENCOUNTER — Encounter: Payer: Self-pay | Admitting: Gastroenterology

## 2016-07-21 ENCOUNTER — Encounter: Payer: Self-pay | Admitting: Gastroenterology

## 2018-08-20 ENCOUNTER — Emergency Department
Admission: EM | Admit: 2018-08-20 | Discharge: 2018-08-20 | Disposition: A | Discharge status: Home / Self Care | Payer: Commercial Managed Care - HMO | Attending: Emergency Medicine | Admitting: Emergency Medicine

## 2018-08-20 ENCOUNTER — Emergency Department: Payer: Commercial Managed Care - HMO

## 2018-08-20 ENCOUNTER — Other Ambulatory Visit: Payer: Self-pay

## 2018-08-20 DIAGNOSIS — R42 Dizziness and giddiness: Secondary | ICD-10-CM | POA: Insufficient documentation

## 2018-08-20 DIAGNOSIS — R55 Syncope and collapse: Secondary | ICD-10-CM | POA: Diagnosis present

## 2018-08-20 DIAGNOSIS — Z7901 Long term (current) use of anticoagulants: Secondary | ICD-10-CM | POA: Insufficient documentation

## 2018-08-20 DIAGNOSIS — R531 Weakness: Secondary | ICD-10-CM | POA: Diagnosis not present

## 2018-08-20 DIAGNOSIS — R079 Chest pain, unspecified: Secondary | ICD-10-CM | POA: Diagnosis not present

## 2018-08-20 DIAGNOSIS — Z79899 Other long term (current) drug therapy: Secondary | ICD-10-CM | POA: Diagnosis not present

## 2018-08-20 DIAGNOSIS — F17210 Nicotine dependence, cigarettes, uncomplicated: Secondary | ICD-10-CM | POA: Diagnosis not present

## 2018-08-20 DIAGNOSIS — I10 Essential (primary) hypertension: Secondary | ICD-10-CM | POA: Insufficient documentation

## 2018-08-20 LAB — BASIC METABOLIC PANEL
Anion gap: 7 (ref 5–15)
BUN: 15 mg/dL (ref 6–20)
CO2: 29 mmol/L (ref 22–32)
Calcium: 9.2 mg/dL (ref 8.9–10.3)
Chloride: 107 mmol/L (ref 98–111)
Creatinine, Ser: 0.92 mg/dL (ref 0.61–1.24)
GFR calc Af Amer: >60 mL/min (ref >60)
GFR calc non Af Amer: >60 mL/min (ref >60)
Glucose, Bld: 111 mg/dL — ABNORMAL HIGH (ref 70–99)
Potassium: 3.5 mmol/L (ref 3.5–5.1)
Sodium: 143 mmol/L (ref 135–145)

## 2018-08-20 LAB — CBC
HCT: 45.4 % (ref 40.0–52.0)
Hemoglobin: 15.2 g/dL (ref 13.0–18.0)
MCH: 26.6 pg (ref 26.0–34.0)
MCHC: 33.4 g/dL (ref 32.0–36.0)
MCV: 79.6 fL — ABNORMAL LOW (ref 80.0–100.0)
Platelets: 207 10*3/uL (ref 150–440)
RBC: 5.7 MIL/uL (ref 4.40–5.90)
RDW: 14.7 % — ABNORMAL HIGH (ref 11.5–14.5)
WBC: 6.8 10*3/uL (ref 3.8–10.6)

## 2018-08-20 LAB — TROPONIN I
Troponin I: 0.03 ng/mL (ref <0.03)
Troponin I: 0.03 ng/mL

## 2018-08-20 MED ORDER — SODIUM CHLORIDE 0.9 % IV BOLUS
1000.0000 mL | Freq: Once | INTRAVENOUS | Status: AC
  Administered 2018-08-20: 1000 mL via INTRAVENOUS

## 2018-08-20 NOTE — Discharge Instructions (Signed)
Please seek medical attention for any high fevers, chest pain, shortness of breath, change in behavior, persistent vomiting, bloody stool or any other new or concerning symptoms.  

## 2018-08-20 NOTE — ED Triage Notes (Signed)
Pt c/o chest pain and dizziness since this morning.

## 2018-08-20 NOTE — ED Notes (Signed)
Patient stood and ambulated per MD request. No c/o dizziness/discomfort.

## 2018-08-20 NOTE — ED Notes (Signed)
Pt says chest pain started at 2pm and quit around 3pm. Pt was dizzy and dizziness made worse by car ride. Hx of ear infection, dizziness/numbness is an ongoing issue.

## 2018-08-20 NOTE — ED Provider Notes (Signed)
[facility] Emergency Department Provider Note  ____________________________________________   I have reviewed the triage vital signs and the nursing notes.   HISTORY  Chief Complaint Chest Pain and Dizziness   History limited by: Not Limited   HPI Ronald Henry is a 57 y.o. male who presents to the emergency department today after a near syncopal episode.  The patient was standing up when it happened.  He said that he felt like something came over him.  He then became weak and had to sit down.  Patient denied any concurrent chest pain or palpitations.  States he had been feeling a little off yesterday and this morning.  Felt like he was coming down with something.  This is now the third time he had a near syncopal episode.  Couple of months ago when he had previous once he was also having bad vertiginous symptoms.  States he was told he had an inner ear problem.  He has since continued to have some issues with dizziness but this is the first time he has had another near syncopal episode.  Patient denies any fever or shortness of breath.  No cough recently.  Per medical record review patient has a history of atrial fibrillation  Past Medical History:  Diagnosis Date  . Atrial fibrillation ([place])   . Hypercholesteremia   . Hypertension     Patient Active Problem List   Diagnosis Date Noted  . Special screening for malignant neoplasms, colon   . Benign neoplasm of transverse colon   . Benign neoplasm of ascending colon   . Benign neoplasm of sigmoid colon     Past Surgical History:  Procedure Laterality Date  . COLONOSCOPY WITH PROPOFOL N/A 07/16/2016   Procedure: COLONOSCOPY WITH PROPOFOL;  Surgeon: Lucilla Lame, MD;  Location: [place];  Service: Endoscopy;  Laterality: N/A;  . NO PAST SURGERIES    . POLYPECTOMY  07/16/2016   Procedure: POLYPECTOMY;  Surgeon: Lucilla Lame, MD;  Location: [place];  Service: Endoscopy;;    Prior to  Admission medications   Medication Sig Start Date End Date Taking? Authorizing Provider  atorvastatin (LIPITOR) 40 MG tablet Take 40 mg by mouth daily.    [provider]  Cholecalciferol (VITAMIN D PO) Take by mouth daily.    [provider]  metoprolol succinate (TOPROL-XL) 25 MG 24 hr tablet Take 25 mg by mouth 2 (two) times daily.    [provider]  metoprolol tartrate (LOPRESSOR) 25 MG tablet TAKE 1 TABLET BY MOUTH TWICE DAILY FOR 90 DAYS 08/10/18   [provider]  rivaroxaban (XARELTO) 20 MG TABS tablet Take 20 mg by mouth daily with supper.    [provider]    Allergies Patient has no known allergies.  No family history on file.  Social History Social History   Tobacco Use  . Smoking status: Current Every Day Smoker    Packs/day: 0.25    Years: 25.00    Pack years: 6.25    Types: Cigarettes  . Smokeless tobacco: Never Used  Substance Use Topics  . Alcohol use: Yes    Alcohol/week: 3.0 standard drinks    Types: 3 Shots of liquor per week  . Drug use: Never    Review of Systems Constitutional: No fever/chills Eyes: No visual changes. ENT: No sore throat. Cardiovascular: Denies chest pain. Respiratory: Denies shortness of breath. Gastrointestinal: No abdominal pain.  No nausea, no vomiting.  No diarrhea.   Genitourinary: Negative  for dysuria. Musculoskeletal: Negative for back pain. Skin: Negative for rash. Neurological: Positive for dizziness and near syncopal episode.   ____________________________________________   PHYSICAL EXAM:  VITAL SIGNS: ED Triage Vitals  Enc Vitals Group     BP 08/20/18 1549 (!) 145/93     Pulse Rate 08/20/18 1549 96     Resp 08/20/18 1549 18     Temp 08/20/18 1549 98.5 F (36.9 C)     Temp src --      SpO2 08/20/18 1549 98 %     Weight 08/20/18 1550 233 lb (105.7 kg)     Height 08/20/18 1550 5\' 11"  (1.803 m)     Head Circumference --      Peak Flow --      Pain Score 08/20/18  1550 3   Constitutional: Alert and oriented.  Eyes: Conjunctivae are normal.  ENT      Head: Normocephalic and atraumatic.      Nose: No congestion/rhinnorhea.      Mouth/Throat: Mucous membranes are moist.      Neck: No stridor. Hematological/Lymphatic/Immunilogical: No cervical lymphadenopathy. Cardiovascular: Normal rate, regular rhythm.  No murmurs, rubs, or gallops. Respiratory: Normal respiratory effort without tachypnea nor retractions. Breath sounds are clear and equal bilaterally. No wheezes/rales/rhonchi. Gastrointestinal: Soft and non tender. No rebound. No guarding.  Genitourinary: Deferred Musculoskeletal: Normal range of motion in all extremities. No lower extremity edema. Neurologic:  Normal speech and language. No gross focal neurologic deficits are appreciated.  Skin:  Skin is warm, dry and intact. No rash noted. Psychiatric: Mood and affect are normal. Speech and behavior are normal. Patient exhibits appropriate insight and judgment.  ____________________________________________    LABS (pertinent positives/negatives)  Trop 0.03 CBC wbc 6.8, hgb 15.2, plt 207 BMP wnl except glu 111  ____________________________________________   EKG  I, Nance Pear, attending physician, personally viewed and interpreted this EKG  EKG Time: 1545 Rate: 86 Rhythm: atrial fibrillation Axis: left axis deviation Intervals: qtc 411 QRS: narrow, q waves v1-v3 ST changes: no st elevation Impression: abnormal ekg   ____________________________________________    RADIOLOGY  CXR Cardiomegaly no acute disease  ____________________________________________   PROCEDURES  Procedures  ____________________________________________   INITIAL IMPRESSION / ASSESSMENT AND PLAN / ED COURSE  Pertinent labs & imaging results that were available during my care of the patient were reviewed by me and considered in my medical decision making (see chart for details).   Patient  presented to the emergency department today after a near syncopal episode.  On exam patient no acute distress.  Patient's initial troponin very minimally elevated at 0.03.  Repeat showed no change.  At this point I doubt ACS.  Patient stated he felt better after IV fluids.  Do think some dehydration might of been contributing to the patient's symptoms.  Discussed return precautions with the patient.   ____________________________________________   FINAL CLINICAL IMPRESSION(S) / ED DIAGNOSES  Final diagnoses:  Dizziness  Near syncope     Note: This dictation was prepared with Dragon dictation. Any transcriptional errors that result from this process are unintentional     Nance Pear, MD 08/20/18 2043

## 2018-08-20 NOTE — ED Notes (Signed)
Reviewed discharge instructions, follow-up care with patient. Patient verbalized understanding of all information reviewed. Patient stable, with no distress noted at this time.    

## 2019-02-06 ENCOUNTER — Emergency Department
Admission: EM | Admit: 2019-02-06 | Discharge: 2019-02-06 | Disposition: A | Discharge status: Home / Self Care | Payer: Worker's Compensation | Attending: Emergency Medicine | Admitting: Emergency Medicine

## 2019-02-06 ENCOUNTER — Other Ambulatory Visit: Payer: Self-pay

## 2019-02-06 ENCOUNTER — Emergency Department: Payer: Worker's Compensation

## 2019-02-06 DIAGNOSIS — Y990 Civilian activity done for income or pay: Secondary | ICD-10-CM | POA: Diagnosis not present

## 2019-02-06 DIAGNOSIS — S76102A Unspecified injury of left quadriceps muscle, fascia and tendon, initial encounter: Secondary | ICD-10-CM | POA: Insufficient documentation

## 2019-02-06 DIAGNOSIS — I10 Essential (primary) hypertension: Secondary | ICD-10-CM | POA: Insufficient documentation

## 2019-02-06 DIAGNOSIS — S76109A Unspecified injury of unspecified quadriceps muscle, fascia and tendon, initial encounter: Secondary | ICD-10-CM

## 2019-02-06 DIAGNOSIS — R52 Pain, unspecified: Secondary | ICD-10-CM

## 2019-02-06 DIAGNOSIS — Y929 Unspecified place or not applicable: Secondary | ICD-10-CM | POA: Insufficient documentation

## 2019-02-06 DIAGNOSIS — Z79899 Other long term (current) drug therapy: Secondary | ICD-10-CM | POA: Diagnosis not present

## 2019-02-06 DIAGNOSIS — S8992XA Unspecified injury of left lower leg, initial encounter: Secondary | ICD-10-CM | POA: Diagnosis present

## 2019-02-06 DIAGNOSIS — Y939 Activity, unspecified: Secondary | ICD-10-CM | POA: Insufficient documentation

## 2019-02-06 DIAGNOSIS — F17210 Nicotine dependence, cigarettes, uncomplicated: Secondary | ICD-10-CM | POA: Diagnosis not present

## 2019-02-06 DIAGNOSIS — W19XXXA Unspecified fall, initial encounter: Secondary | ICD-10-CM | POA: Diagnosis not present

## 2019-02-06 MED ORDER — CYCLOBENZAPRINE HCL 5 MG PO TABS: ORAL_TABLET | ORAL | 0 refills | Status: DC

## 2019-02-06 MED ORDER — OXYCODONE-ACETAMINOPHEN 5-325 MG PO TABS
1.0000 | ORAL_TABLET | Freq: Once | ORAL | Status: AC
  Administered 2019-02-06: 1 via ORAL
  Filled 2019-02-06: qty 1

## 2019-02-06 NOTE — Discharge Instructions (Signed)
I am concerned that you tore or ruptured your quadriceps tendon.  Please call orthopedics for an appointment as soon as possible.  Wear knee immobilizer and do not bear any weight on your left leg.  Use crutches.  Ice and elevate area tonight.

## 2019-02-06 NOTE — ED Triage Notes (Signed)
Pt arrived via [facility] from work. EMS reports that the pt states that he had a fall at work on a concrete step next to the side walk and he heard something "pop" in his left leg. Pt denies any LOC, dizziness or other trauma. Pt has a history of atrial fibrillation and is taking xarelto.   Pt is AOx4, vss, he c/o of right leg pain at 9/10, he has +2 pedal pulses and is able to move his entire leg and all of his toes. He denies chest pain, SHOB, fever or headache at this time.

## 2019-02-06 NOTE — ED Provider Notes (Signed)
[facility] Emergency Department Provider Note  ____________________________________________  Time seen: Approximately 3:16 PM  I have reviewed the triage vital signs and the nursing notes.   HISTORY  Chief Complaint Fall    HPI Ronald Henry is a 58 y.o. male that presents emergency department for evaluation of left thigh pain after fall today.  Patient states that he was going up and down a ladder for work and when he got to the last step, his knee popped and he fell.  He states that pain is to his front thigh.  No numbness, tingling.  Past Medical History:  Diagnosis Date  . Atrial fibrillation ([place])   . Hypercholesteremia   . Hypertension     Patient Active Problem List   Diagnosis Date Noted  . Special screening for malignant neoplasms, colon   . Benign neoplasm of transverse colon   . Benign neoplasm of ascending colon   . Benign neoplasm of sigmoid colon     Past Surgical History:  Procedure Laterality Date  . COLONOSCOPY WITH PROPOFOL N/A 07/16/2016   Procedure: COLONOSCOPY WITH PROPOFOL;  Surgeon: Lucilla Lame, MD;  Location: [place];  Service: Endoscopy;  Laterality: N/A;  . NO PAST SURGERIES    . POLYPECTOMY  07/16/2016   Procedure: POLYPECTOMY;  Surgeon: Lucilla Lame, MD;  Location: [place];  Service: Endoscopy;;    Prior to Admission medications   Medication Sig Start Date End Date Taking? Authorizing Provider  atorvastatin (LIPITOR) 40 MG tablet Take 40 mg by mouth daily.    [provider]  Cholecalciferol (VITAMIN D PO) Take 400 Units by mouth daily.     [provider]  cyclobenzaprine (FLEXERIL) 5 MG tablet Take 1-2 tablets 3 times daily as needed 02/06/19   Laban Emperor, PA-C  metoprolol succinate (TOPROL-XL) 25 MG 24 hr tablet Take 25 mg by mouth 2 (two) times daily.    [provider]  metoprolol tartrate (LOPRESSOR) 25 MG tablet TAKE 1 TABLET BY MOUTH TWICE DAILY FOR 90 DAYS  08/10/18   [provider]  rivaroxaban (XARELTO) 20 MG TABS tablet Take 20 mg by mouth daily with supper.    [provider]    Allergies Patient has no known allergies.  History reviewed. No pertinent family history.  Social History Social History   Tobacco Use  . Smoking status: Current Every Day Smoker    Packs/day: 0.25    Years: 25.00    Pack years: 6.25    Types: Cigarettes  . Smokeless tobacco: Never Used  Substance Use Topics  . Alcohol use: Yes    Alcohol/week: 3.0 standard drinks    Types: 3 Shots of liquor per week  . Drug use: Never     Review of Systems  Cardiovascular: No chest pain. Respiratory: No SOB. Gastrointestinal: No abdominal pain.  No nausea, no vomiting.  Musculoskeletal: Positive for thigh pain.  Skin: Negative for rash, abrasions, lacerations, ecchymosis. Neurological: Negative for headaches, numbness or tingling   ____________________________________________   PHYSICAL EXAM:  VITAL SIGNS: ED Triage Vitals  Enc Vitals Group     BP 02/06/19 1454 (!) 147/93     Pulse Rate 02/06/19 1454 88     Resp 02/06/19 1454 18     Temp 02/06/19 1454 98.3 F (36.8 C)     Temp Source 02/06/19 1454 Oral     SpO2 02/06/19 1454 97 %     Weight 02/06/19 1455 260 lb (117.9 kg)  Height 02/06/19 1455 5\' 10"  (1.778 m)     Head Circumference --      Peak Flow --      Pain Score 02/06/19 1454 9     Pain Loc --      Pain Edu? --      Excl. in [place]? --      Constitutional: Alert and oriented. Well appearing and in no acute distress. Eyes: Conjunctivae are normal. PERRL. EOMI. Head: Atraumatic. ENT:      Ears:      Nose: No congestion/rhinnorhea.      Mouth/Throat: Mucous membranes are moist.  Neck: No stridor. Cardiovascular: Normal rate, regular rhythm.  Good peripheral circulation. Respiratory: Normal respiratory effort without tachypnea or retractions. Lungs CTAB. Good air entry to the bases with no decreased or absent breath  sounds. Musculoskeletal: Full range of motion to all extremities. No gross deformities appreciated.  Tenderness to palpation over quadriceps tendon.  Unable to perform extension of left knee.  Full passive range of motion of left knee. Neurologic:  Normal speech and language. No gross focal neurologic deficits are appreciated.  Skin:  Skin is warm, dry and intact. No rash noted. Psychiatric: Mood and affect are normal. Speech and behavior are normal. Patient exhibits appropriate insight and judgement.   ____________________________________________   LABS (all labs ordered are listed, but only abnormal results are displayed)  Labs Reviewed - No data to display ____________________________________________  EKG   ____________________________________________  RADIOLOGY Robinette Haines, personally viewed and evaluated these images (plain radiographs) as part of my medical decision making, as well as reviewing the written report by the radiologist.  Dg Knee Complete 4 Views Left  Result Date: 02/06/2019 CLINICAL DATA:  Left knee pain after fall at work. EXAM: LEFT KNEE - COMPLETE 4+ VIEW COMPARISON:  None. FINDINGS: No evidence of fracture, dislocation, or joint effusion. Mild narrowing of medial and lateral joint spaces is noted. Mild patellar spurring is noted. Soft tissues are unremarkable. IMPRESSION: Mild degenerative joint disease is noted. No acute abnormality seen in the left knee. Electronically Signed   By: Marijo Conception, M.D.   On: 02/06/2019 16:06   Dg Femur Min 2 Views Left  Result Date: 02/06/2019 CLINICAL DATA:  Left leg pain EXAM: LEFT FEMUR 2 VIEWS COMPARISON:  None. FINDINGS: No acute fracture or dislocation. No aggressive osseous lesion. No soft tissue abnormality. Peripheral vascular atherosclerotic disease. IMPRESSION: No acute osseous injury of the left femur. Electronically Signed   By: Kathreen Devoid   On: 02/06/2019 16:04     ____________________________________________    PROCEDURES  Procedure(s) performed:    Procedures    Medications  oxyCODONE-acetaminophen (PERCOCET/ROXICET) 5-325 MG per tablet 1 tablet (1 tablet Oral Given 02/06/19 1645)     ____________________________________________   INITIAL IMPRESSION / ASSESSMENT AND PLAN / ED COURSE  Pertinent labs & imaging results that were available during my care of the patient were reviewed by me and considered in my medical decision making (see chart for details).  Review of the [place] CSRS was performed in accordance of the [place] prior to dispensing any controlled drugs.   Patient's diagnosis is consistent with quadriceps injury.  Vital signs and exam are reassuring.  X-rays are negative for acute bony abnormalities.  Exam is consistent with quadriceps tear or rupture.  Knee immobilizer was placed and crutches were given.  Patient will call orthopedics tomorrow for an appointment as soon as possible.  Patient will be discharged home with prescriptions for  Flexeril. Patient is to follow up with Ortho as directed. Patient is given ED precautions to return to the ED for any worsening or new symptoms.     ____________________________________________  FINAL CLINICAL IMPRESSION(S) / ED DIAGNOSES  Final diagnoses:  Injury of quadriceps muscle      NEW MEDICATIONS STARTED DURING THIS VISIT:  ED Discharge Orders         Ordered    cyclobenzaprine (FLEXERIL) 5 MG tablet     02/06/19 1656              This chart was dictated using voice recognition software/Dragon. Despite best efforts to proofread, errors can occur which can change the meaning. Any change was purely unintentional.    Laban Emperor, PA-C 02/06/19 2112    Eula Listen, MD 02/06/19 [phone]

## 2019-02-13 DIAGNOSIS — S76119A Strain of unspecified quadriceps muscle, fascia and tendon, initial encounter: Secondary | ICD-10-CM | POA: Insufficient documentation

## 2020-02-15 ENCOUNTER — Ambulatory Visit: Payer: Self-pay | Attending: Internal Medicine

## 2020-02-15 DIAGNOSIS — Z23 Encounter for immunization: Secondary | ICD-10-CM

## 2020-02-15 NOTE — Progress Notes (Signed)
   Covid-19 Vaccination Clinic  Name:  Ronald Henry    MRN: [id number] DOB: [birth year]  02/15/2020  [name] was observed post Covid-19 immunization for 15 minutes without incident. He was provided with Vaccine Information Sheet and instruction to access the V-Safe system.   [name] was instructed to call 911 with any severe reactions post vaccine: Marland Kitchen Difficulty breathing  . Swelling of face and throat  . A fast heartbeat  . A bad rash all over body  . Dizziness and weakness   Immunizations Administered    Name Date Dose VIS Date Route   Pfizer COVID-19 Vaccine 02/15/2020 10:16 AM 0.3 mL 10/26/2019 Intramuscular   Manufacturer: [place]   Lot: [id number]   [place]: [id number]

## 2020-03-11 ENCOUNTER — Ambulatory Visit: Payer: Self-pay

## 2020-03-11 ENCOUNTER — Ambulatory Visit: Payer: Self-pay | Attending: Internal Medicine

## 2020-03-11 DIAGNOSIS — Z23 Encounter for immunization: Secondary | ICD-10-CM

## 2020-03-11 NOTE — Progress Notes (Signed)
   Covid-19 Vaccination Clinic  Name:  Ronald Henry    MRN: [id number] DOB: November 19, 1960  03/11/2020  [name] was observed post Covid-19 immunization for 15 minutes without incident. He was provided with Vaccine Information Sheet and instruction to access the V-Safe system.   [name] was instructed to call 911 with any severe reactions post vaccine: Marland Kitchen Difficulty breathing  . Swelling of face and throat  . A fast heartbeat  . A bad rash all over body  . Dizziness and weakness   Immunizations Administered    Name Date Dose VIS Date Route   Pfizer COVID-19 Vaccine 03/11/2020  4:10 PM 0.3 mL 01/09/2019 Intramuscular   Manufacturer: [place]   Lot: [id number]   [place]: [id number]

## 2021-02-03 ENCOUNTER — Ambulatory Visit: Payer: 59 | Admitting: Urology

## 2021-02-09 ENCOUNTER — Other Ambulatory Visit: Payer: Self-pay | Admitting: *Deleted

## 2021-02-09 DIAGNOSIS — R3121 Asymptomatic microscopic hematuria: Secondary | ICD-10-CM

## 2021-02-10 ENCOUNTER — Encounter: Payer: Self-pay | Admitting: Urology

## 2021-02-10 ENCOUNTER — Ambulatory Visit (INDEPENDENT_AMBULATORY_CARE_PROVIDER_SITE_OTHER): Payer: 59 | Admitting: Urology

## 2021-02-10 ENCOUNTER — Other Ambulatory Visit: Payer: Self-pay

## 2021-02-10 ENCOUNTER — Other Ambulatory Visit
Admission: RE | Admit: 2021-02-10 | Discharge: 2021-02-10 | Disposition: A | Discharge status: Home / Self Care | Payer: 59 | Attending: Urology | Admitting: Urology

## 2021-02-10 VITALS — BP 146/97 | HR 97 | Ht 70.0 in | Wt 261.0 lb

## 2021-02-10 DIAGNOSIS — R361 Hematospermia: Secondary | ICD-10-CM

## 2021-02-10 DIAGNOSIS — Z125 Encounter for screening for malignant neoplasm of prostate: Secondary | ICD-10-CM

## 2021-02-10 DIAGNOSIS — R3121 Asymptomatic microscopic hematuria: Secondary | ICD-10-CM

## 2021-02-10 LAB — URINALYSIS, COMPLETE (UACMP) WITH MICROSCOPIC
Bilirubin Urine: NEGATIVE
Glucose, UA: NEGATIVE mg/dL
Ketones, ur: NEGATIVE mg/dL
Leukocytes,Ua: NEGATIVE
Nitrite: NEGATIVE
Protein, ur: 100 mg/dL — AB
Specific Gravity, Urine: >1.03 — ABNORMAL HIGH (ref 1.005–1.030)
pH: 5.5 (ref 5.0–8.0)

## 2021-02-10 NOTE — Progress Notes (Signed)
   02/10/21 9:27 AM   Jenny Reichmann Montey Hora 1961/04/07 [id number]  CC: Microscopic hematuria, hematospermia  HPI: I saw Mr. Agostinelli in clinic for the above issues.  He is a 60 year old African-American male with atrial fibrillation on Xarelto who reports a few episodes of hematospermia a few weeks ago.  This was nonpainful.  He also was seen by his PCP and had dipstick positive blood in the urine.  Urinalysis today confirms microscopic hematuria with 6-10 RBCs, 0-5 WBCs.  He denies any urinary symptoms of dysuria, urgency, frequency, incontinence, or weak stream.  He has a 20-pack-year smoking history, and continues to smoke about 5 cigarettes a day.  He denies any carcinogenic exposures.  He has an older brother with prostate cancer, but he is unsure of the details.  He has never had a PSA checked previously.   PMH: Past Medical History:  Diagnosis Date  . Atrial fibrillation ([place])   . Hypercholesteremia   . Hypertension     Surgical History: Past Surgical History:  Procedure Laterality Date  . COLONOSCOPY WITH PROPOFOL N/A 07/16/2016   Procedure: COLONOSCOPY WITH PROPOFOL;  Surgeon: Lucilla Lame, MD;  Location: [place];  Service: Endoscopy;  Laterality: N/A;  . NO PAST SURGERIES    . POLYPECTOMY  07/16/2016   Procedure: POLYPECTOMY;  Surgeon: Lucilla Lame, MD;  Location: [place];  Service: Endoscopy;;    Family History: No family history on file.  Social History:  reports that he has been smoking cigarettes. He has a 6.25 pack-year smoking history. He has never used smokeless tobacco. He reports current alcohol use of about 3.0 standard drinks of alcohol per week. He reports that he does not use drugs.  Physical Exam: BP (!) 146/97   Pulse 97   Ht 5\' 10"  (1.778 m)   Wt 261 lb (118.4 kg)   BMI 37.45 kg/m    Constitutional:  Alert and oriented, No acute distress. Cardiovascular: No clubbing, cyanosis, or edema. Respiratory: Normal respiratory effort, no  increased work of breathing. GI: Abdomen is soft, nontender, nondistended, no abdominal masses GU: Uncircumcised phallus, meatus on ventral side of glans, no lesions, testicles 20 cc and descended bilaterally without masses DRE: Limited secondary to body habitus, 30 g, smooth, no nodules or masses  Laboratory Data: Reviewed, see HPI  Pertinent Imaging: None to review  Assessment & Plan:   60 year old male with a symptomatic microscopic hematuria with 6-10 RBCs today on urinalysis, as well as multiple episodes of hematospermia over the last few weeks.  He also has a family history of prostate cancer, no prior PSA screening.  We discussed the risks and benefits of PSA screening, and with his family history and hematospermia I recommended checking a PSA today.  We will call with those results.  We discussed common possible etiologies of microscopic hematuria including idiopathic, urolithiasis, medical renal disease, and malignancy. We discussed the new asymptomatic microscopic hematuria guidelines and risk categories of low, intermediate, and high risk that are based on age, risk factors like smoking, and degree of microscopic hematuria. We discussed work-up can range from repeat urinalysis, renal ultrasound and cystoscopy, to CT urogram and cystoscopy.  They fall into the high risk category, and I recommended proceeding with CT urogram and cystoscopy.   PSA with reflex to free today Hematuria work-up with CT urogram and cystoscopy  Nickolas Madrid, MD 02/10/2021  [facility] [address] [place], [place] [address] [phone]

## 2021-02-10 NOTE — Patient Instructions (Signed)
Cystoscopy Cystoscopy is a procedure that is used to help diagnose and sometimes treat conditions that affect the lower urinary tract. The lower urinary tract includes the bladder and the urethra. The urethra is the tube that drains urine from the bladder. Cystoscopy is done using a thin, tube-shaped instrument with a light and camera at the end (cystoscope). The cystoscope may be hard or flexible, depending on the goal of the procedure. The cystoscope is inserted through the urethra, into the bladder. Cystoscopy may be recommended if you have:  Urinary tract infections that keep coming back.  Blood in the urine (hematuria).  An inability to control when you urinate (urinary incontinence) or an overactive bladder.  Unusual cells found in a urine sample.  A blockage in the urethra, such as a urinary stone.  Painful urination.  An abnormality in the bladder found during an intravenous pyelogram (IVP) or CT scan. Cystoscopy may also be done to remove a sample of tissue to be examined under a microscope (biopsy). What are the risks? Generally, this is a safe procedure. However, problems may occur, including:  Infection.  Bleeding.  What happens during the procedure?  1. You will be given one or more of the following: ? A medicine to numb the area (local anesthetic). 2. The area around the opening of your urethra will be cleaned. 3. The cystoscope will be passed through your urethra into your bladder. 4. Germ-free (sterile) fluid will flow through the cystoscope to fill your bladder. The fluid will stretch your bladder so that your health care provider can clearly examine your bladder walls. 5. Your doctor will look at the urethra and bladder. 6. The cystoscope will be removed The procedure may vary among health care providers  What can I expect after the procedure? After the procedure, it is common to have: 1. Some soreness or pain in your abdomen and urethra. 2. Urinary symptoms.  These include: ? Mild pain or burning when you urinate. Pain should stop within a few minutes after you urinate. This may last for up to 1 week. ? A small amount of blood in your urine for several days. ? Feeling like you need to urinate but producing only a small amount of urine. Follow these instructions at home: General instructions  Return to your normal activities as told by your health care provider.   Do not drive for 24 hours if you were given a sedative during your procedure.  Watch for any blood in your urine. If the amount of blood in your urine increases, call your health care provider.  If a tissue sample was removed for testing (biopsy) during your procedure, it is up to you to get your test results. Ask your health care provider, or the department that is doing the test, when your results will be ready.  Drink enough fluid to keep your urine pale yellow.  Keep all follow-up visits as told by your health care provider. This is important. Contact a health care provider if you:  Have pain that gets worse or does not get better with medicine, especially pain when you urinate.  Have trouble urinating.  Have more blood in your urine. Get help right away if you:  Have blood clots in your urine.  Have abdominal pain.  Have a fever or chills.  Are unable to urinate. Summary  Cystoscopy is a procedure that is used to help diagnose and sometimes treat conditions that affect the lower urinary tract.  Cystoscopy is done using   a thin, tube-shaped instrument with a light and camera at the end.  After the procedure, it is common to have some soreness or pain in your abdomen and urethra.  Watch for any blood in your urine. If the amount of blood in your urine increases, call your health care provider.  If you were prescribed an antibiotic medicine, take it as told by your health care provider. Do not stop taking the antibiotic even if you start to feel better. This  information is not intended to replace advice given to you by your health care provider. Make sure you discuss any questions you have with your health care provider. Document Revised: 10/24/2018 Document Reviewed: 10/24/2018 Elsevier Patient Education  2020 Elsevier Inc.   

## 2021-02-11 LAB — PSA (REFLEX TO FREE) (SERIAL): Prostate Specific Ag, Serum: 0.2 ng/mL (ref 0.0–4.0)

## 2021-02-12 ENCOUNTER — Telehealth: Payer: Self-pay

## 2021-02-12 NOTE — Telephone Encounter (Signed)
-----   Message from Billey Co, MD sent at 02/11/2021  5:29 PM EDT ----- Good news, PSA is normal.  Keep follow-up for cystoscopy and CT to complete hematuria work-up  Nickolas Madrid, MD 02/11/2021

## 2021-02-12 NOTE — Telephone Encounter (Signed)
Called pt informed him of the information below. Pt gave verbal understanding.  

## 2021-02-26 ENCOUNTER — Other Ambulatory Visit: Payer: Self-pay

## 2021-02-26 ENCOUNTER — Ambulatory Visit
Admission: RE | Admit: 2021-02-26 | Discharge: 2021-02-26 | Disposition: A | Discharge status: Home / Self Care | Payer: 59 | Source: Ambulatory Visit | Attending: Urology | Admitting: Urology

## 2021-02-26 DIAGNOSIS — R3121 Asymptomatic microscopic hematuria: Secondary | ICD-10-CM | POA: Diagnosis not present

## 2021-02-26 LAB — POCT I-STAT CREATININE: Creatinine, Ser: 1.1 mg/dL (ref 0.61–1.24)

## 2021-02-26 MED ORDER — IOHEXOL 300 MG/ML  SOLN
150.0000 mL | Freq: Once | INTRAMUSCULAR | Status: AC | PRN
  Administered 2021-02-26: 125 mL via INTRAVENOUS

## 2021-03-04 ENCOUNTER — Encounter: Payer: Self-pay | Admitting: Urology

## 2021-03-04 ENCOUNTER — Other Ambulatory Visit: Payer: Self-pay

## 2021-03-04 ENCOUNTER — Ambulatory Visit: Payer: 59 | Admitting: Urology

## 2021-03-04 VITALS — BP 153/91 | HR 103 | Ht 70.0 in | Wt 261.0 lb

## 2021-03-04 DIAGNOSIS — F17200 Nicotine dependence, unspecified, uncomplicated: Secondary | ICD-10-CM | POA: Insufficient documentation

## 2021-03-04 DIAGNOSIS — G4733 Obstructive sleep apnea (adult) (pediatric): Secondary | ICD-10-CM | POA: Insufficient documentation

## 2021-03-04 DIAGNOSIS — E559 Vitamin D deficiency, unspecified: Secondary | ICD-10-CM | POA: Insufficient documentation

## 2021-03-04 DIAGNOSIS — I480 Paroxysmal atrial fibrillation: Secondary | ICD-10-CM | POA: Insufficient documentation

## 2021-03-04 DIAGNOSIS — N281 Cyst of kidney, acquired: Secondary | ICD-10-CM

## 2021-03-04 DIAGNOSIS — H9319 Tinnitus, unspecified ear: Secondary | ICD-10-CM | POA: Insufficient documentation

## 2021-03-04 DIAGNOSIS — R718 Other abnormality of red blood cells: Secondary | ICD-10-CM | POA: Insufficient documentation

## 2021-03-04 DIAGNOSIS — Z6835 Body mass index (BMI) 35.0-35.9, adult: Secondary | ICD-10-CM | POA: Insufficient documentation

## 2021-03-04 DIAGNOSIS — R3121 Asymptomatic microscopic hematuria: Secondary | ICD-10-CM

## 2021-03-04 DIAGNOSIS — E782 Mixed hyperlipidemia: Secondary | ICD-10-CM | POA: Insufficient documentation

## 2021-03-04 DIAGNOSIS — R42 Dizziness and giddiness: Secondary | ICD-10-CM | POA: Insufficient documentation

## 2021-03-04 DIAGNOSIS — K429 Umbilical hernia without obstruction or gangrene: Secondary | ICD-10-CM | POA: Insufficient documentation

## 2021-03-04 DIAGNOSIS — I10 Essential (primary) hypertension: Secondary | ICD-10-CM | POA: Insufficient documentation

## 2021-03-04 MED ORDER — LIDOCAINE HCL URETHRAL/MUCOSAL 2 % EX GEL
1.0000 "application " | Freq: Once | CUTANEOUS | Status: AC
  Administered 2021-03-04: 1 via URETHRAL

## 2021-03-04 NOTE — Progress Notes (Signed)
Cystoscopy Procedure Note:  Indication: Microscopic hematuria  After informed consent and discussion of the procedure and its risks, ISADORE PALECEK was positioned and prepped in the standard fashion. Cystoscopy was performed with a flexible cystoscope. The urethra, bladder neck and entire bladder was visualized in a standard fashion. The prostate was moderate in size. The ureteral orifices were visualized in their normal location and orientation.  Mild bladder trabeculations, no suspicious lesions, no abnormalities on retroflexion.  Imaging: Overall benign, small renal cysts <1cm likely hemorrhagic/proteinaceous cysts  Findings: Normal cystoscopy  Assessment and Plan: RTC 1 year with renal ultrasound to evaluate change in renal cyst, likely can discontinue any surveillance if benign at that time, or consider MRI for further work-up  Nickolas Madrid, MD 03/04/2021

## 2022-01-19 ENCOUNTER — Telehealth: Payer: Self-pay

## 2022-01-19 NOTE — Telephone Encounter (Signed)
CALLED PATIENT NO ANSWER LEFT VOICEMAIL FOR A CALL BACK ? ?

## 2022-01-20 ENCOUNTER — Other Ambulatory Visit: Payer: Self-pay

## 2022-01-20 DIAGNOSIS — Z86010 Personal history of colonic polyps: Secondary | ICD-10-CM

## 2022-01-20 MED ORDER — PEG 3350-KCL-NA BICARB-NACL 420 G PO SOLR: 4000.0000 mL | Freq: Once | ORAL | 0 refills | Status: AC

## 2022-01-20 NOTE — Progress Notes (Unsigned)
Gastroenterology Pre-Procedure Review ? ?Request Date: 02/19/2022 ?Requesting Physician: Dr. Allen Norris ? ?PATIENT REVIEW QUESTIONS: The patient responded to the following health history questions as indicated:   ? ?1. Are you having any GI issues? no ?2. Do you have a personal history of Polyps? yes (last colonoscopy) ?3. Do you have a family history of Colon Cancer or Polyps? yes (brother colon cancer ) ?4. Diabetes Mellitus? no ?5. Joint replacements in the past 12 months?no ?6. Major health problems in the past 3 months?no ?7. Any artificial heart valves, MVP, or defibrillator?no ?   ?MEDICATIONS & ALLERGIES:    ?Patient reports the following regarding taking any anticoagulation/antiplatelet therapy:   ?Plavix, Coumadin, Eliquis, Xarelto, Lovenox, Pradaxa, Brilinta, or Effient? yes (xarelto ) ?Aspirin? no ? ?Patient confirms/reports the following medications:  ?Current Outpatient Medications  ?Medication Sig Dispense Refill  ? atorvastatin (LIPITOR) 40 MG tablet Take 1 tablet by mouth daily.    ? buPROPion (WELLBUTRIN) 100 MG tablet 1 tablet    ? carvedilol (COREG) 12.5 MG tablet Take 12.5 mg by mouth 2 (two) times daily.    ? nitroGLYCERIN (NITROSTAT) 0.4 MG SL tablet     ? rivaroxaban (XARELTO) 20 MG TABS tablet Take 20 mg by mouth daily with supper.    ? Vitamin D, Ergocalciferol, (DRISDOL) 1.25 MG (50000 UNIT) CAPS capsule Take 50,000 Units by mouth once a week.    ? ?No current facility-administered medications for this visit.  ? ? ?Patient confirms/reports the following allergies:  ?No Known Allergies ? ?No orders of the defined types were placed in this encounter. ? ? ?AUTHORIZATION INFORMATION ?Primary Insurance: ?1D#: ?Group #: ? ?Secondary Insurance: ?1D#: ?Group #: ? ?SCHEDULE INFORMATION: ?Date:02/19/2022  ?Time: ?Location:[facility] ? ?

## 2022-01-27 ENCOUNTER — Telehealth: Payer: Self-pay

## 2022-01-27 NOTE — Telephone Encounter (Signed)
Called patient about his blood thinner clearance he can stop his xarelto 48 prior and restart 48 hours after procedure per [facility]  ?

## 2022-02-25 ENCOUNTER — Ambulatory Visit
Admission: RE | Admit: 2022-02-25 | Discharge: 2022-02-25 | Disposition: A | Discharge status: Home / Self Care | Payer: 59 | Source: Ambulatory Visit | Attending: Urology | Admitting: Urology

## 2022-02-25 DIAGNOSIS — N281 Cyst of kidney, acquired: Secondary | ICD-10-CM | POA: Insufficient documentation

## 2022-03-02 ENCOUNTER — Ambulatory Visit (INDEPENDENT_AMBULATORY_CARE_PROVIDER_SITE_OTHER): Payer: 59 | Admitting: Urology

## 2022-03-02 ENCOUNTER — Encounter: Payer: Self-pay | Admitting: Urology

## 2022-03-02 VITALS — BP 143/82 | HR 96 | Ht 70.0 in | Wt 256.0 lb

## 2022-03-02 DIAGNOSIS — N281 Cyst of kidney, acquired: Secondary | ICD-10-CM

## 2022-03-02 DIAGNOSIS — R3121 Asymptomatic microscopic hematuria: Secondary | ICD-10-CM | POA: Diagnosis not present

## 2022-03-02 DIAGNOSIS — Z125 Encounter for screening for malignant neoplasm of prostate: Secondary | ICD-10-CM | POA: Diagnosis not present

## 2022-03-02 NOTE — Patient Instructions (Signed)

## 2022-03-02 NOTE — Progress Notes (Signed)
? ?  03/02/2022 ?9:01 AM  ? ?Ronald Arenas Sr. ?[birth year] ?[id number] ? ?Reason for visit: Follow up PSA screening, renal cysts, history of microscopic hematuria ? ?HPI: ?61 year old male who underwent a negative microscopic hematuria work-up in April 2022, CT at that time showed small cyst but follow-up renal ultrasound was recommended to confirm no solid component.  He denies any gross hematuria or recurrent hematospermia since her last visit, and denies any problems today. ? ?I personally viewed and interpreted the renal ultrasound dated 02/25/2022 that shows no hydronephrosis, and simple benign-appearing cyst bilaterally.  Reassurance was provided regarding these findings, and no further follow-up imaging is needed. ? ?He has a family history of prostate cancer in his brother, his PSA was normal at 0.2 in March 2022.  We reviewed the AUA guidelines that recommend PSA screening every 1 to 2 years up to age 33.  He will continue PSA screening via PCP in the future. ? ?Follow-up with urology as needed ? ?Billey Co, MD ? ?[facility] ?[address] ?[place], [place] [address] ?[phone] ? ? ?

## 2022-03-04 ENCOUNTER — Ambulatory Visit: Payer: Self-pay | Admitting: Urology

## 2022-03-10 ENCOUNTER — Encounter: Payer: Self-pay | Admitting: Gastroenterology

## 2022-03-10 ENCOUNTER — Telehealth: Payer: Self-pay

## 2022-03-10 NOTE — Telephone Encounter (Signed)
-----   Message from Lady Gary, RN sent at 03/10/2022  4:19 PM EDT ----- ?Colonoscopy pt for 5/5.  On Xarelto.  Says he wasn't given instructions about when to stop.   ? ?

## 2022-03-10 NOTE — Telephone Encounter (Signed)
Left detailed msg on VM per HIPAA ?With instructions to stop 2 days prior to procedure and restart 2 days after procedure  ?

## 2022-03-10 NOTE — Telephone Encounter (Signed)
Ronald Henry, Ronald Henry, CMA ?  ? ?4:28 PM ?Note ?Called patient about his blood thinner clearance he can stop his xarelto 48 prior and restart 48 hours after procedure per [facility]   ?  ? ?

## 2022-03-15 NOTE — Telephone Encounter (Signed)
Left detailed msg on VM per HIPAA  

## 2022-03-17 NOTE — Telephone Encounter (Signed)
I spoke to pt and he stated that he did hear my VM and last time he took Xarelto was last night 03/16/2022 ?

## 2022-03-19 ENCOUNTER — Ambulatory Visit
Admission: RE | Admit: 2022-03-19 | Discharge: 2022-03-19 | Disposition: A | Discharge status: Home / Self Care | Payer: 59 | Attending: Gastroenterology | Admitting: Gastroenterology

## 2022-03-19 ENCOUNTER — Ambulatory Visit: Payer: 59 | Admitting: Anesthesiology

## 2022-03-19 ENCOUNTER — Encounter
Admission: RE | Disposition: A | Discharge status: Home / Self Care | Payer: Self-pay | Source: Home / Self Care | Attending: Gastroenterology

## 2022-03-19 ENCOUNTER — Other Ambulatory Visit: Payer: Self-pay

## 2022-03-19 DIAGNOSIS — I4891 Unspecified atrial fibrillation: Secondary | ICD-10-CM | POA: Diagnosis not present

## 2022-03-19 DIAGNOSIS — I10 Essential (primary) hypertension: Secondary | ICD-10-CM | POA: Insufficient documentation

## 2022-03-19 DIAGNOSIS — Z860100 Personal history of colon polyps, unspecified: Secondary | ICD-10-CM

## 2022-03-19 DIAGNOSIS — F17210 Nicotine dependence, cigarettes, uncomplicated: Secondary | ICD-10-CM | POA: Insufficient documentation

## 2022-03-19 DIAGNOSIS — E785 Hyperlipidemia, unspecified: Secondary | ICD-10-CM | POA: Diagnosis not present

## 2022-03-19 DIAGNOSIS — Z1211 Encounter for screening for malignant neoplasm of colon: Secondary | ICD-10-CM | POA: Insufficient documentation

## 2022-03-19 DIAGNOSIS — K641 Second degree hemorrhoids: Secondary | ICD-10-CM | POA: Diagnosis not present

## 2022-03-19 DIAGNOSIS — D128 Benign neoplasm of rectum: Secondary | ICD-10-CM | POA: Diagnosis not present

## 2022-03-19 DIAGNOSIS — Z6836 Body mass index (BMI) 36.0-36.9, adult: Secondary | ICD-10-CM | POA: Diagnosis not present

## 2022-03-19 DIAGNOSIS — K621 Rectal polyp: Secondary | ICD-10-CM

## 2022-03-19 HISTORY — PX: POLYPECTOMY: SHX5525

## 2022-03-19 HISTORY — PX: COLONOSCOPY WITH PROPOFOL: SHX5780

## 2022-03-19 SURGERY — COLONOSCOPY WITH PROPOFOL
Anesthesia: General | Site: Rectum

## 2022-03-19 MED ORDER — SODIUM CHLORIDE 0.9 % IV SOLN: INTRAVENOUS | Status: DC

## 2022-03-19 MED ORDER — STERILE WATER FOR IRRIGATION IR SOLN
Status: DC | PRN
  Administered 2022-03-19: 180 mL

## 2022-03-19 MED ORDER — STERILE WATER FOR IRRIGATION IR SOLN
Status: DC | PRN
  Administered 2022-03-19: 200 mL

## 2022-03-19 MED ORDER — ACETAMINOPHEN 325 MG PO TABS: 650.0000 mg | ORAL_TABLET | ORAL | Status: DC | PRN

## 2022-03-19 MED ORDER — ONDANSETRON HCL 4 MG/2ML IJ SOLN: 4.0000 mg | Freq: Once | INTRAMUSCULAR | Status: DC | PRN

## 2022-03-19 MED ORDER — LIDOCAINE HCL (CARDIAC) PF 100 MG/5ML IV SOSY
PREFILLED_SYRINGE | INTRAVENOUS | Status: DC | PRN
Start: 2022-03-19 — End: 2022-03-19
  Administered 2022-03-19: 30 mg via INTRAVENOUS

## 2022-03-19 MED ORDER — PROPOFOL 10 MG/ML IV BOLUS
INTRAVENOUS | Status: DC | PRN
Start: 2022-03-19 — End: 2022-03-19
  Administered 2022-03-19: 30 mg via INTRAVENOUS
  Administered 2022-03-19: 120 mg via INTRAVENOUS
  Administered 2022-03-19 (×2): 20 mg via INTRAVENOUS
  Administered 2022-03-19: 30 mg via INTRAVENOUS
  Administered 2022-03-19 (×2): 20 mg via INTRAVENOUS

## 2022-03-19 MED ORDER — LACTATED RINGERS IV SOLN: INTRAVENOUS | Status: DC

## 2022-03-19 MED ORDER — ACETAMINOPHEN 160 MG/5ML PO SOLN: 325.0000 mg | ORAL | Status: DC | PRN

## 2022-03-19 SURGICAL SUPPLY — 8 items
GOWN CVR UNV OPN BCK APRN NK (MISCELLANEOUS) ×2 IMPLANT
GOWN ISOL THUMB LOOP REG UNIV (MISCELLANEOUS) ×4
KIT PRC NS LF DISP ENDO (KITS) ×1 IMPLANT
KIT PROCEDURE OLYMPUS (KITS) ×2
MANIFOLD NEPTUNE II (INSTRUMENTS) ×2 IMPLANT
SNARE COLD EXACTO (MISCELLANEOUS) ×1 IMPLANT
TRAP ETRAP POLY (MISCELLANEOUS) ×1 IMPLANT
WATER STERILE IRR 250ML POUR (IV SOLUTION) ×2 IMPLANT

## 2022-03-19 NOTE — H&P (Signed)
? ?Lucilla Lame, MD [facility] ?[address] ?[place], [place] [address] ?[phone] ?Fax : [phone] ? ?Primary Care Physician:  Abran Richard, MD ?Primary Gastroenterologist:  Dr. Allen Norris ? ?Pre-Procedure History & Physical: ?HPI:  Ronald MEHRINGER Sr. is a 61 y.o. male is here for an colonoscopy. ?  ?Past Medical History:  ?Diagnosis Date  ? Atrial fibrillation ([place])   ? Hypercholesteremia   ? Hypertension   ? ? ?Past Surgical History:  ?Procedure Laterality Date  ? COLONOSCOPY WITH PROPOFOL N/A 07/16/2016  ? Procedure: COLONOSCOPY WITH PROPOFOL;  Surgeon: Lucilla Lame, MD;  Location: [facility];  Service: Endoscopy;  Laterality: N/A;  ? POLYPECTOMY  07/16/2016  ? Procedure: POLYPECTOMY;  Surgeon: Lucilla Lame, MD;  Location: [facility];  Service: Endoscopy;;  ? ? ?Prior to Admission medications   ?Medication Sig Start Date End Date Taking? Authorizing Provider  ?amLODipine (NORVASC) 2.5 MG tablet Take 2.5 mg by mouth daily. 12/27/21  Yes [provider]  ?atorvastatin (LIPITOR) 40 MG tablet Take 1 tablet by mouth daily. 02/16/21  Yes [provider]  ?carvedilol (COREG) 12.5 MG tablet Take 12.5 mg by mouth 2 (two) times daily. 12/10/20  Yes [provider]  ?ergocalciferol (VITAMIN D2) 1.25 MG (50000 UT) capsule Take 50,000 Units by mouth once a week.   Yes [provider]  ?nitroGLYCERIN (NITROSTAT) 0.4 MG SL tablet    Yes [provider]  ?rivaroxaban (XARELTO) 20 MG TABS tablet Take 20 mg by mouth daily with supper.   Yes [provider]  ? ? ?Allergies as of 01/20/2022  ? (No Known Allergies)  ? ? ?History reviewed. No pertinent family history. ? ?Social History  ? ?Socioeconomic History  ? Marital status: Married  ?  Spouse name: Not on file  ? Number of children: Not on file  ? Years of education: Not on file  ? Highest education level: Not on file  ?Occupational History  ? Not on file  ?Tobacco Use  ? Smoking status: Every Day  ?   Packs/day: 0.25  ?  Years: 44.00  ?  Pack years: 11.00  ?  Types: Cigarettes  ? Smokeless tobacco: Never  ? Tobacco comments:  ?  Started smoking age 86  ?Vaping Use  ? Vaping Use: Never used  ?Substance and Sexual Activity  ? Alcohol use: Yes  ?  Alcohol/week: 3.0 standard drinks  ?  Types: 3 Shots of liquor per week  ? Drug use: Never  ? Sexual activity: Yes  ?  Birth control/protection: None  ?Other Topics Concern  ? Not on file  ?Social History Narrative  ? Not on file  ? ?Social Determinants of Health  ? ?Financial Resource Strain: Not on file  ?Food Insecurity: Not on file  ?Transportation Needs: Not on file  ?Physical Activity: Not on file  ?Stress: Not on file  ?Social Connections: Not on file  ?Intimate Partner Violence: Not on file  ? ? ?Review of Systems: ?See HPI, otherwise negative ROS ? ?Physical Exam: ?BP (!) 142/89   Pulse 68   Temp 97.9 ?F (36.6 ?C) (Temporal)   Resp 20   Ht '5\' 10"'$  (1.778 m)   Wt 112.5 kg   SpO2 96%   BMI 35.58 kg/m?  ?General:   Alert,  pleasant and cooperative in NAD ?Head:  Normocephalic and atraumatic. ?Neck:  Supple; no masses or thyromegaly. ?Lungs:  Clear throughout to auscultation.    ?Heart:  Regular rate and rhythm. ?Abdomen:  Soft, nontender and nondistended. Normal bowel sounds, without guarding, and without rebound.   ?Neurologic:  Alert and  oriented x4;  grossly normal neurologically. ? ?Impression/Plan: ?Ronald LABELLA Sr. is here for an colonoscopy to be performed for a history of adenomatous polyps on 2017 ? ? ?Risks, benefits, limitations, and alternatives regarding  colonoscopy have been reviewed with the patient.  Questions have been answered.  All parties agreeable. ? ? ?Lucilla Lame, MD  03/19/2022, 7:57 AM ?

## 2022-03-19 NOTE — Transfer of Care (Signed)
Immediate Anesthesia Transfer of Care Note ? ?Patient: Ronald LITAKER Sr. ? ?Procedure(s) Performed: COLONOSCOPY WITH PROPOFOL (Rectum) ?POLYPECTOMY ? ?Patient Location: PACU ? ?Anesthesia Type: General ? ?Level of Consciousness: awake, alert  and patient cooperative ? ?Airway and Oxygen Therapy: Patient Spontanous Breathing and Patient connected to supplemental oxygen ? ?Post-op Assessment: Post-op Vital signs reviewed, Patient's Cardiovascular Status Stable, Respiratory Function Stable, Patent Airway and No signs of Nausea or vomiting ? ?Post-op Vital Signs: Reviewed and stable ? ?Complications: No notable events documented. ? ?

## 2022-03-19 NOTE — Anesthesia Postprocedure Evaluation (Signed)
Anesthesia Post Note ? ?Patient: Ronald EVES Sr. ? ?Procedure(s) Performed: COLONOSCOPY WITH PROPOFOL (Rectum) ?POLYPECTOMY (Rectum) ? ? ?  ?Patient location during evaluation: PACU ?Anesthesia Type: General ?Level of consciousness: awake and alert ?Pain management: pain level controlled ?Vital Signs Assessment: post-procedure vital signs reviewed and stable ?Respiratory status: spontaneous breathing, nonlabored ventilation, respiratory function stable and patient connected to nasal cannula oxygen ?Cardiovascular status: blood pressure returned to baseline and stable ?Postop Assessment: no apparent nausea or vomiting ?Anesthetic complications: no ? ? ?No notable events documented. ? ?Janella Rogala A  Didi Ganaway ? ? ? ? ? ?

## 2022-03-19 NOTE — Anesthesia Procedure Notes (Signed)
Date/Time: 03/19/2022 8:33 AM ?Performed by: Cameron Ali, CRNA ?Pre-anesthesia Checklist: Patient identified, Emergency Drugs available, Suction available, Timeout performed and Patient being monitored ?Patient Re-evaluated:Patient Re-evaluated prior to induction ?Oxygen Delivery Method: Nasal cannula ?Placement Confirmation: positive ETCO2 ? ? ? ? ?

## 2022-03-19 NOTE — Anesthesia Preprocedure Evaluation (Signed)
Anesthesia Evaluation  ?Patient identified by MRN, date of birth, ID band ?Patient awake ? ? ? ?Reviewed: ?Allergy & Precautions, NPO status , Patient's Chart, lab work & pertinent test results, reviewed documented beta blocker date and time  ? ?History of Anesthesia Complications ?Negative for: history of anesthetic complications ? ?Airway ?Mallampati: III ? ?TM Distance: >3 FB ?Neck ROM: Full ? ? ? Dental ? ?(+)  ?  ?Pulmonary ?sleep apnea , Current Smoker and Patient abstained from smoking.,  ?  ?breath sounds clear to auscultation ? ? ? ? ? ? Cardiovascular ?hypertension, (-) angina(-) DOE + dysrhythmias (on Xarelto) Atrial Fibrillation  ?Rhythm:Regular Rate:Normal ? ? ?HLD ?  ?Neuro/Psych ?  ? GI/Hepatic ?neg GERD  ,  ?Endo/Other  ?Morbid obesity (BMI 36) ? Renal/GU ?  ? ?  ?Musculoskeletal ? ? Abdominal ?  ?Peds ? Hematology ?  ?Anesthesia Other Findings ? ? Reproductive/Obstetrics ? ?  ? ? ? ? ? ? ? ? ? ? ? ? ? ?  ?  ? ? ? ? ? ? ? ?Anesthesia Physical ?Anesthesia Plan ? ?ASA: 3 ? ?Anesthesia Plan: General  ? ?Post-op Pain Management:   ? ?Induction: Intravenous ? ?PONV Risk Score and Plan: 1 and Propofol infusion, TIVA and Treatment may vary due to age or medical condition ? ?Airway Management Planned: Natural Airway and Nasal Cannula ? ?Additional Equipment:  ? ?Intra-op Plan:  ? ?Post-operative Plan:  ? ?Informed Consent: I have reviewed the patients History and Physical, chart, labs and discussed the procedure including the risks, benefits and alternatives for the proposed anesthesia with the patient or authorized representative who has indicated his/her understanding and acceptance.  ? ? ? ? ? ?Plan Discussed with: CRNA and Anesthesiologist ? ?Anesthesia Plan Comments:   ? ? ? ? ? ? ?Anesthesia Quick Evaluation ? ?

## 2022-03-19 NOTE — Op Note (Signed)
[facility] ?Gastroenterology ?Patient Name: Ronald Henry ?Procedure Date: 03/19/2022 8:28 AM ?MRN: [id number] ?Account #: [id number] ?Date of Birth: 09-22-1961 ?Admit Type: Outpatient ?Age: 61 ?Room: [facility] OR ROOM 01 ?Gender: Male ?Note Status: Finalized ?Instrument Name: [id number] ?Procedure:             Colonoscopy ?Indications:           Screening for colorectal malignant neoplasm ?Providers:             Lucilla Lame MD, MD ?Referring MD:          Abran Richard (Referring MD) ?Medicines:             Propofol per Anesthesia ?Complications:         No immediate complications. ?Procedure:             Pre-Anesthesia Assessment: ?                       - Prior to the procedure, a History and Physical was  ?                       performed, and patient medications and allergies were  ?                       reviewed. The patient's tolerance of previous  ?                       anesthesia was also reviewed. The risks and benefits  ?                       of the procedure and the sedation options and risks  ?                       were discussed with the patient. All questions were  ?                       answered, and informed consent was obtained. Prior  ?                       Anticoagulants: The patient has taken no previous  ?                       anticoagulant or antiplatelet agents. ASA Grade  ?                       Assessment: II - A patient with mild systemic disease.  ?                       After reviewing the risks and benefits, the patient  ?                       was deemed in satisfactory condition to undergo the  ?                       procedure. ?                       After obtaining informed consent, the colonoscope was  ?  passed under direct vision. Throughout the procedure,  ?                       the patient's blood pressure, pulse, and oxygen  ?                       saturations were monitored continuously. The was  ?                       introduced through  the anus and advanced to the the  ?                       cecum, identified by appendiceal orifice and ileocecal  ?                       valve. The colonoscopy was performed without  ?                       difficulty. The patient tolerated the procedure well.  ?                       The quality of the bowel preparation was excellent. ?Findings: ?     The perianal and digital rectal examinations were normal. ?     A 4 mm polyp was found in the rectum. The polyp was sessile. The polyp  ?     was removed with a cold snare. Resection and retrieval were complete. ?     Non-bleeding internal hemorrhoids were found during retroflexion. The  ?     hemorrhoids were Grade II (internal hemorrhoids that prolapse but reduce  ?     spontaneously). ?Impression:            - One 4 mm polyp in the rectum, removed with a cold  ?                       snare. Resected and retrieved. ?                       - Non-bleeding internal hemorrhoids. ?Recommendation:        - Discharge patient to home. ?                       - Resume previous diet. ?                       - Continue present medications. ?                       - Await pathology results. ?                       - Repeat colonoscopy in 7 years for surveillance. ?Procedure Code(s):     --- Professional --- ?                       [phone], Colonoscopy, flexible; with removal of  ?                       tumor(s), polyp(s), or other lesion(s) by snare  ?  technique ?Diagnosis Code(s):     --- Professional --- ?                       Z12.11, Encounter for screening for malignant neoplasm  ?                       of colon ?                       K62.1, Rectal polyp ?CPT copyright 2019 [employer]. All rights reserved. ?The codes documented in this report are preliminary and upon coder review may  ?be revised to meet current compliance requirements. ?Lucilla Lame MD, MD ?03/19/2022 8:49:23 AM ?This report has been signed electronically. ?Number of  Addenda: 0 ?Note Initiated On: 03/19/2022 8:28 AM ?Scope Withdrawal Time: 0 hours 8 minutes 38 seconds  ?Total Procedure Duration: 0 hours 11 minutes 29 seconds  ?Estimated Blood Loss:  Estimated blood loss: none. ?     [facility] ?

## 2022-03-22 ENCOUNTER — Encounter: Payer: Self-pay | Admitting: Gastroenterology

## 2022-03-22 LAB — SURGICAL PATHOLOGY

## 2022-10-01 IMAGING — US US RENAL
1 series · 14 of 25 positions shown · non-contrast
Comparison: CT done on 02/26/2021

CLINICAL DATA: Renal cysts

EXAM:
RENAL / URINARY TRACT ULTRASOUND COMPLETE

[Series 1: us renal · 0.26mm/px · 14 of 36 slices shown]
[im 1/36]
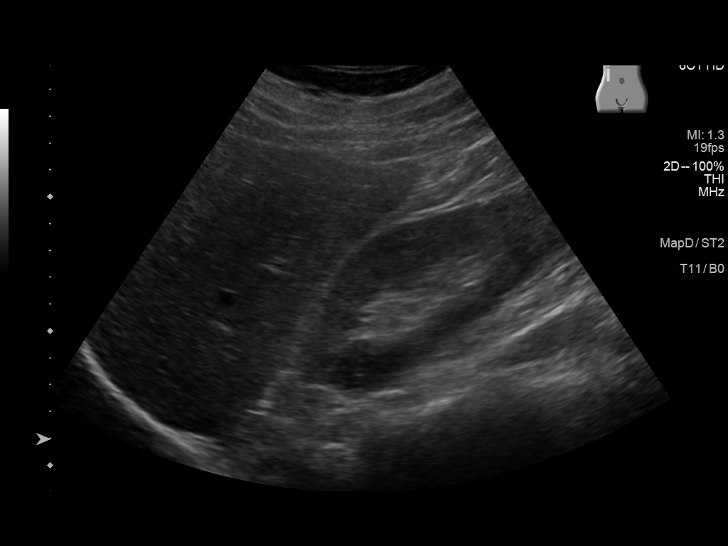
[im 3/36]
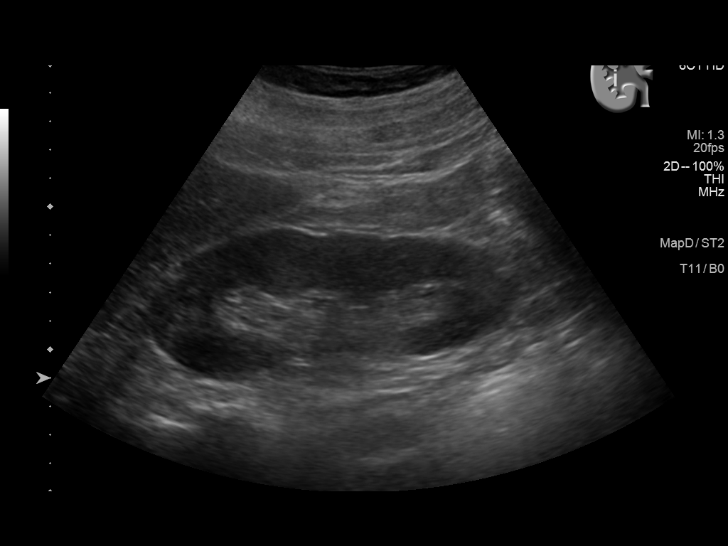
[im 6/36]
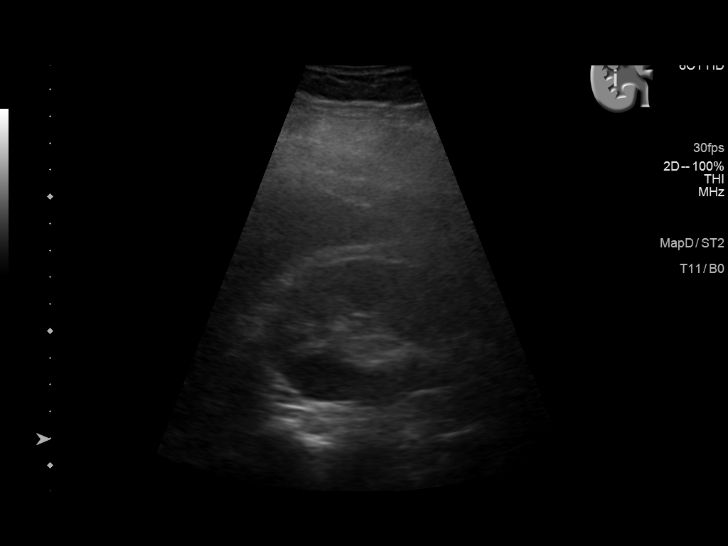
[im 9/36]
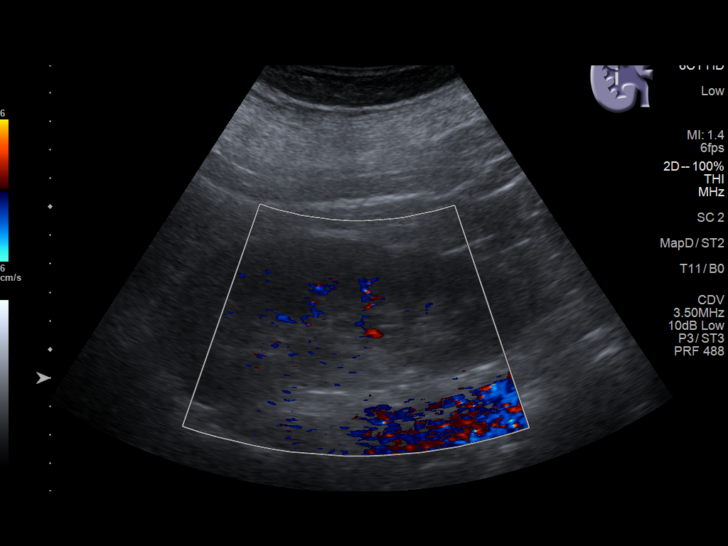
[im 12/36]
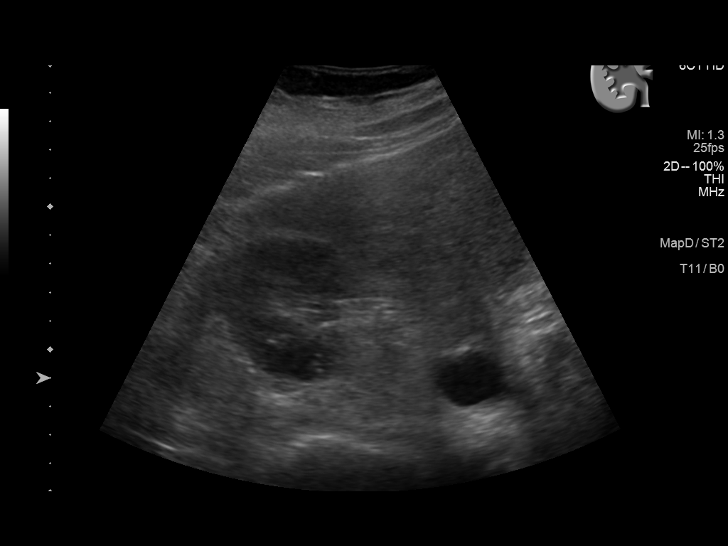
[im 14/36]
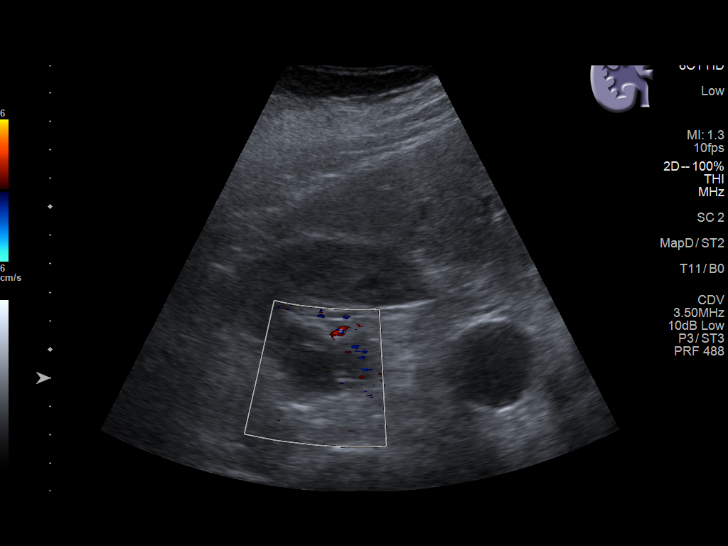
[im 17/36]
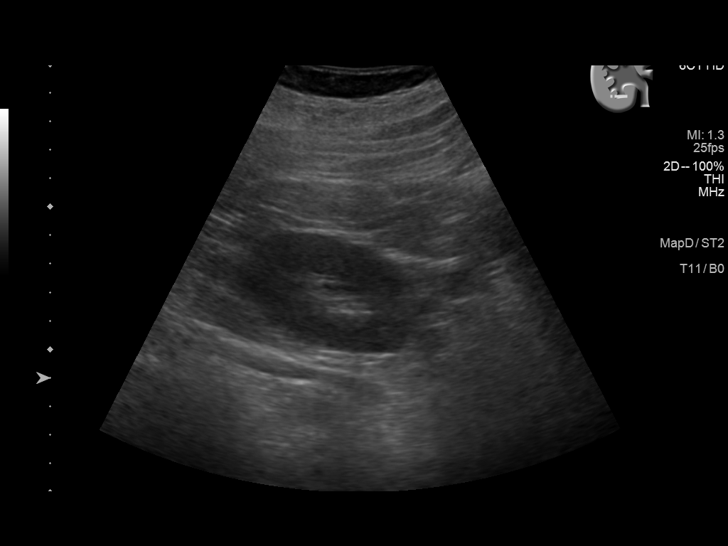
[im 19/36]
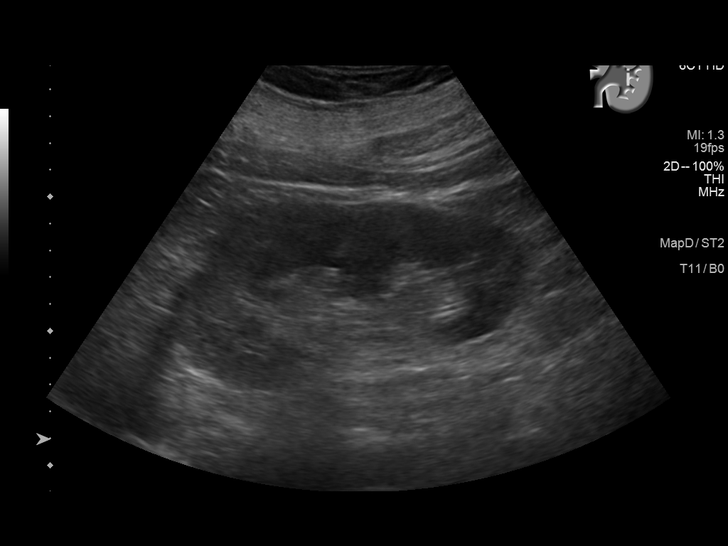
[im 22/36]
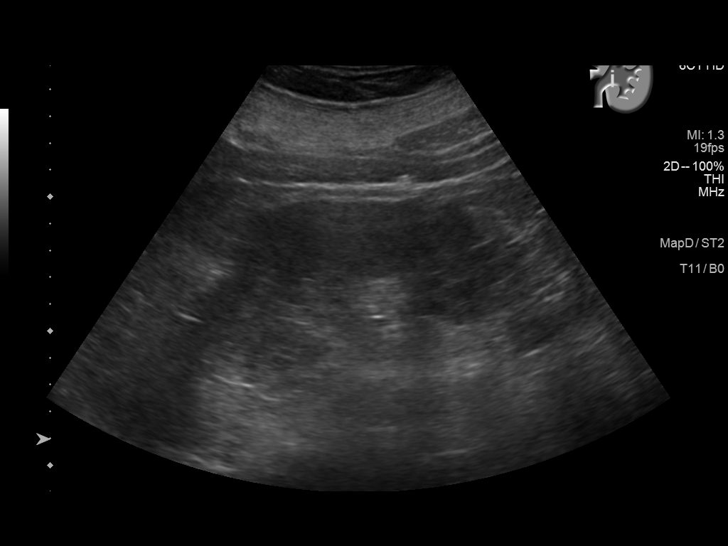
[im 24/36]
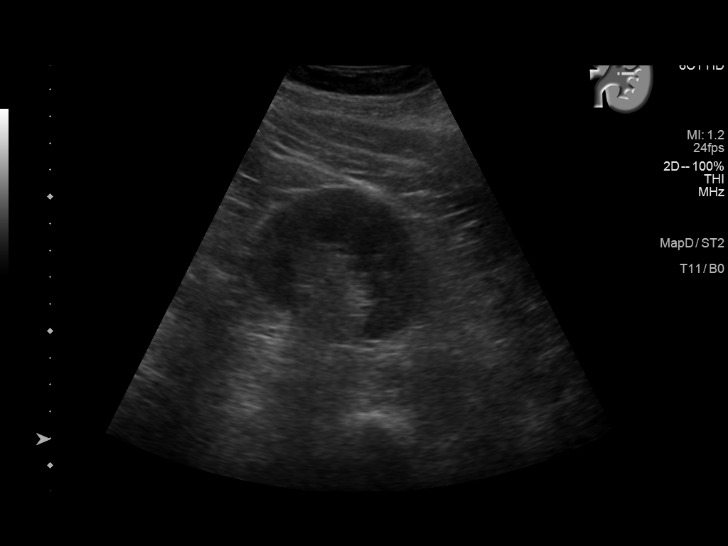
[im 27/36]
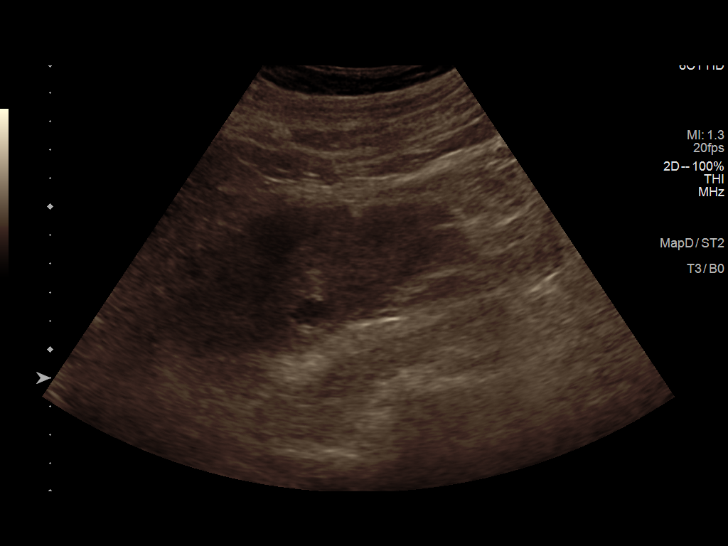
[im 30/36]
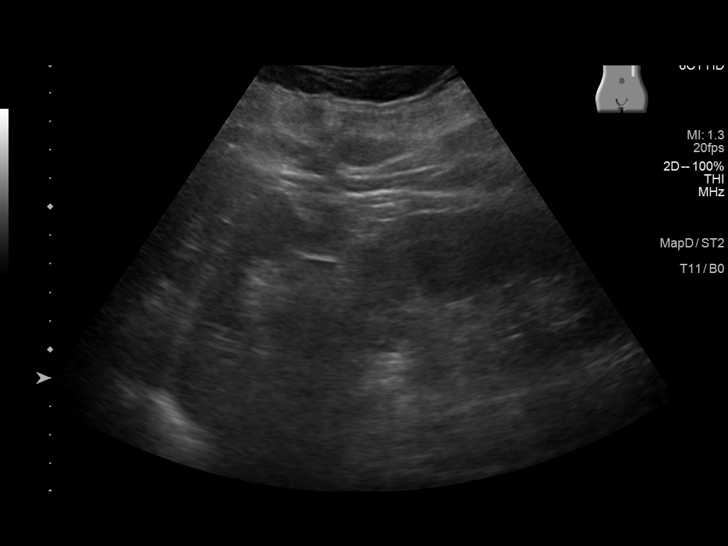
[im 33/36]
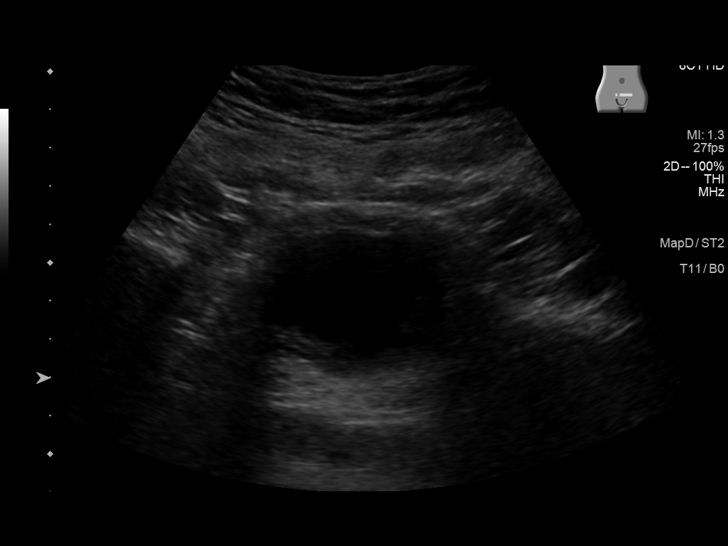
[im 36/36]
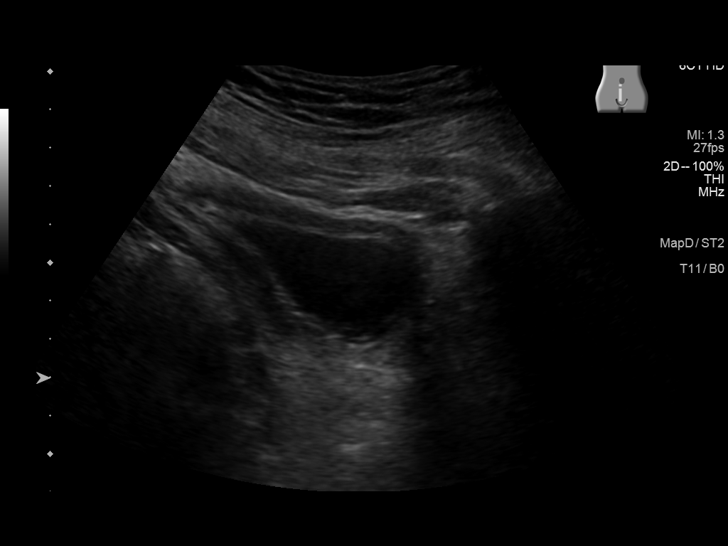

[14 of 25 positions shown; findings below may reference images not displayed]

FINDINGS: Right Kidney:

Renal measurements: 13.3 x 4.6 x 6.6 cm = volume: 209 mL. There is
no hydronephrosis. There is 2.4 x 1.9 cm cyst in the lower pole.
Cortical echogenicity is unremarkable.

Left Kidney:

Renal measurements: 13.6 x 6.7 x 5.8 cm = volume: 274 mL. There is
no hydronephrosis. There is 1.2 cm cystic structure in the lateral
aspect of midportion of left kidney.

Bladder:

Appears normal for degree of bladder distention.

Other:

None.
IMPRESSION: There is no hydronephrosis.  There are bilateral renal cysts.
